# Patient Record
Sex: Male | Born: 1960 | Race: Black or African American | Hispanic: No | Marital: Married | Smoking: Never smoker
Health system: Southern US, Community
[De-identification: ages and names within clinical notes are randomized; demographics above are authoritative.]

## PROBLEM LIST (undated history)

## (undated) DIAGNOSIS — K760 Fatty (change of) liver, not elsewhere classified: Secondary | ICD-10-CM

## (undated) DIAGNOSIS — I10 Essential (primary) hypertension: Secondary | ICD-10-CM

## (undated) DIAGNOSIS — R7303 Prediabetes: Secondary | ICD-10-CM

## (undated) DIAGNOSIS — K649 Unspecified hemorrhoids: Secondary | ICD-10-CM

## (undated) DIAGNOSIS — F1910 Other psychoactive substance abuse, uncomplicated: Secondary | ICD-10-CM

## (undated) DIAGNOSIS — M1990 Unspecified osteoarthritis, unspecified site: Secondary | ICD-10-CM

## (undated) DIAGNOSIS — J45909 Unspecified asthma, uncomplicated: Secondary | ICD-10-CM

## (undated) HISTORY — PX: COLONOSCOPY: SHX174

## (undated) HISTORY — DX: Unspecified hemorrhoids: K64.9

## (undated) HISTORY — PX: NO PAST SURGERIES: SHX2092

---

## 2004-02-21 ENCOUNTER — Emergency Department (HOSPITAL_COMMUNITY): Admission: EM | Admit: 2004-02-21 | Discharge: 2004-02-21 | Payer: Self-pay | Admitting: Emergency Medicine

## 2005-05-04 ENCOUNTER — Emergency Department (HOSPITAL_COMMUNITY): Admission: EM | Admit: 2005-05-04 | Discharge: 2005-05-05 | Payer: Self-pay | Admitting: Emergency Medicine

## 2005-05-12 ENCOUNTER — Emergency Department (HOSPITAL_COMMUNITY): Admission: EM | Admit: 2005-05-12 | Discharge: 2005-05-12 | Payer: Self-pay | Admitting: Family Medicine

## 2005-08-09 ENCOUNTER — Emergency Department (HOSPITAL_COMMUNITY): Admission: EM | Admit: 2005-08-09 | Discharge: 2005-08-09 | Payer: Self-pay | Admitting: Emergency Medicine

## 2005-10-19 ENCOUNTER — Emergency Department (HOSPITAL_COMMUNITY): Admission: EM | Admit: 2005-10-19 | Discharge: 2005-10-19 | Payer: Self-pay | Admitting: Family Medicine

## 2006-03-20 ENCOUNTER — Emergency Department (HOSPITAL_COMMUNITY): Admission: EM | Admit: 2006-03-20 | Discharge: 2006-03-21 | Payer: Self-pay | Admitting: Emergency Medicine

## 2006-04-20 ENCOUNTER — Emergency Department (HOSPITAL_COMMUNITY): Admission: EM | Admit: 2006-04-20 | Discharge: 2006-04-20 | Payer: Self-pay | Admitting: Emergency Medicine

## 2006-04-22 ENCOUNTER — Emergency Department (HOSPITAL_COMMUNITY): Admission: EM | Admit: 2006-04-22 | Discharge: 2006-04-22 | Payer: Self-pay | Admitting: Emergency Medicine

## 2006-07-08 ENCOUNTER — Emergency Department (HOSPITAL_COMMUNITY): Admission: EM | Admit: 2006-07-08 | Discharge: 2006-07-08 | Payer: Self-pay | Admitting: Emergency Medicine

## 2006-10-08 ENCOUNTER — Emergency Department (HOSPITAL_COMMUNITY): Admission: EM | Admit: 2006-10-08 | Discharge: 2006-10-09 | Payer: Self-pay | Admitting: Emergency Medicine

## 2006-10-22 ENCOUNTER — Emergency Department (HOSPITAL_COMMUNITY): Admission: EM | Admit: 2006-10-22 | Discharge: 2006-10-22 | Payer: Self-pay | Admitting: Family Medicine

## 2009-07-15 ENCOUNTER — Emergency Department (HOSPITAL_COMMUNITY): Admission: EM | Admit: 2009-07-15 | Discharge: 2009-07-16 | Payer: Self-pay | Admitting: Emergency Medicine

## 2010-05-24 LAB — URINALYSIS, ROUTINE W REFLEX MICROSCOPIC
Bilirubin Urine: NEGATIVE
Glucose, UA: NEGATIVE mg/dL
Hgb urine dipstick: NEGATIVE
Ketones, ur: NEGATIVE mg/dL
Leukocytes, UA: NEGATIVE
Nitrite: NEGATIVE
Protein, ur: 100 mg/dL — AB
Specific Gravity, Urine: 1.025 (ref 1.005–1.030)
Urobilinogen, UA: 0.2 mg/dL (ref 0.0–1.0)
pH: 8.5 — ABNORMAL HIGH (ref 5.0–8.0)

## 2010-05-24 LAB — CBC
HCT: 42.3 % (ref 39.0–52.0)
Hemoglobin: 14.9 g/dL (ref 13.0–17.0)
MCHC: 35.2 g/dL (ref 30.0–36.0)
MCV: 99.2 fL (ref 78.0–100.0)
Platelets: 242 10*3/uL (ref 150–400)
RBC: 4.27 MIL/uL (ref 4.22–5.81)
RDW: 12.5 % (ref 11.5–15.5)
WBC: 12.1 10*3/uL — ABNORMAL HIGH (ref 4.0–10.5)

## 2010-05-24 LAB — COMPREHENSIVE METABOLIC PANEL
ALT: 13 U/L (ref 0–53)
AST: 27 U/L (ref 0–37)
Albumin: 4.2 g/dL (ref 3.5–5.2)
Alkaline Phosphatase: 48 U/L (ref 39–117)
BUN: 12 mg/dL (ref 6–23)
CO2: 25 mEq/L (ref 19–32)
Calcium: 9.7 mg/dL (ref 8.4–10.5)
Chloride: 105 mEq/L (ref 96–112)
Creatinine, Ser: 1.5 mg/dL (ref 0.4–1.5)
GFR calc Af Amer: >60 mL/min (ref >60)
GFR calc non Af Amer: 50 mL/min — ABNORMAL LOW (ref >60)
Glucose, Bld: 155 mg/dL — ABNORMAL HIGH (ref 70–99)
Potassium: 3.3 mEq/L — ABNORMAL LOW (ref 3.5–5.1)
Sodium: 141 mEq/L (ref 135–145)
Total Bilirubin: 1.9 mg/dL — ABNORMAL HIGH (ref 0.3–1.2)
Total Protein: 7.4 g/dL (ref 6.0–8.3)

## 2010-05-24 LAB — URINE MICROSCOPIC-ADD ON

## 2010-05-24 LAB — DIFFERENTIAL
Basophils Absolute: 0 10*3/uL (ref 0.0–0.1)
Basophils Relative: 0 % (ref 0–1)
Eosinophils Absolute: 0 10*3/uL (ref 0.0–0.7)
Eosinophils Relative: 0 % (ref 0–5)
Lymphocytes Relative: 2 % — ABNORMAL LOW (ref 12–46)
Lymphs Abs: 0.3 10*3/uL — ABNORMAL LOW (ref 0.7–4.0)
Monocytes Absolute: 0.4 10*3/uL (ref 0.1–1.0)
Monocytes Relative: 3 % (ref 3–12)
Neutro Abs: 11.5 10*3/uL — ABNORMAL HIGH (ref 1.7–7.7)
Neutrophils Relative %: 94 % — ABNORMAL HIGH (ref 43–77)

## 2010-05-24 LAB — POCT I-STAT, CHEM 8
BUN: 14 mg/dL (ref 6–23)
Calcium, Ion: 1.06 mmol/L — ABNORMAL LOW (ref 1.12–1.32)
Chloride: 106 mEq/L (ref 96–112)
Creatinine, Ser: 1.3 mg/dL (ref 0.4–1.5)
Glucose, Bld: 157 mg/dL — ABNORMAL HIGH (ref 70–99)
HCT: 44 % (ref 39.0–52.0)
Hemoglobin: 15 g/dL (ref 13.0–17.0)
Potassium: 3.5 mEq/L (ref 3.5–5.1)
Sodium: 142 mEq/L (ref 135–145)
TCO2: 25 mmol/L (ref 0–100)

## 2010-05-24 LAB — LIPASE, BLOOD: Lipase: 17 U/L (ref 11–59)

## 2011-03-15 ENCOUNTER — Emergency Department (INDEPENDENT_AMBULATORY_CARE_PROVIDER_SITE_OTHER)
Admission: EM | Admit: 2011-03-15 | Discharge: 2011-03-15 | Disposition: A | Discharge status: Home / Self Care | Payer: 59 | Source: Home / Self Care | Attending: Emergency Medicine | Admitting: Emergency Medicine

## 2011-03-15 ENCOUNTER — Encounter (HOSPITAL_COMMUNITY): Payer: Self-pay

## 2011-03-15 DIAGNOSIS — J40 Bronchitis, not specified as acute or chronic: Secondary | ICD-10-CM

## 2011-03-15 MED ORDER — AMOXICILLIN 500 MG PO CAPS: 500.0000 mg | ORAL_CAPSULE | Freq: Three times a day (TID) | ORAL | Status: AC

## 2011-03-15 MED ORDER — GUAIFENESIN-CODEINE 100-10 MG/5ML PO SYRP: 5.0000 mL | ORAL_SOLUTION | Freq: Three times a day (TID) | ORAL | Status: AC | PRN

## 2011-03-15 NOTE — ED Notes (Signed)
Hist of asthma as child, feels as if it is coming back, reports yellow secretions; coarse breath sounds noted bilateral ; also concerned about spots on the palms of his hands (undetermined time)

## 2011-03-15 NOTE — ED Provider Notes (Signed)
History     CSN: [id number]  Arrival date & time 03/15/11  1256   First MD Initiated Contact with Patient 03/15/11 1435      Chief Complaint  Patient presents with  . Cough    (Consider location/radiation/quality/duration/timing/severity/associated sxs/prior treatment) Patient is a 51 y.o. male presenting with cough. The history is provided by the patient.  Cough This is a recurrent problem. The current episode started more than 1 week ago. The problem occurs constantly. The cough is productive of brown sputum. There has been no fever. Associated symptoms include ear congestion. Pertinent negatives include no chills, no sweats, no shortness of breath and no wheezing. He has tried decongestants for the symptoms. The treatment provided no relief. He is not a smoker. His past medical history is significant for asthma.    History reviewed. No pertinent past medical history.  History reviewed. No pertinent past surgical history.  History reviewed. No pertinent family history.  History  Substance Use Topics  . Smoking status: Never Smoker   . Smokeless tobacco: Not on file  . Alcohol Use: Yes      Review of Systems  Constitutional: Negative for chills.  Eyes: Negative for pain.  Respiratory: Positive for cough. Negative for shortness of breath and wheezing.   Skin: Positive for rash.    Allergies  Review of patient's allergies indicates no known allergies.  Home Medications   Current Outpatient Rx  Name Route Sig Dispense Refill  . AMOXICILLIN 500 MG PO CAPS Oral Take 1 capsule (500 mg total) by mouth 3 (three) times daily. 30 capsule 0  . GUAIFENESIN-CODEINE 100-10 MG/5ML PO SYRP Oral Take 5 mLs by mouth 3 (three) times daily as needed for cough. 120 mL 0    BP 126/85  Pulse 76  Temp(Src) 98.3 F (36.8 C) (Oral)  Resp 16  SpO2 100%  Physical Exam  Nursing note and vitals reviewed. Constitutional: He appears well-developed and well-nourished.  HENT:    Mouth/Throat: No oropharyngeal exudate.  Eyes: Conjunctivae are normal.  Neck: No JVD present.  Cardiovascular: Normal rate and regular rhythm.   Pulmonary/Chest: Not tachypneic. No respiratory distress. He has no decreased breath sounds. He has no wheezes. He has rhonchi. He has no rales.  Lymphadenopathy:    He has no cervical adenopathy.  Skin: Rash noted.       ED Course  Procedures (including critical care time)   Labs Reviewed  RPR  HIV ANTIBODY (ROUTINE TESTING)   No results found.   1. Bronchitis       MDM  Recurrent cough- now productive also with a papular erythematous eruption on palmar side both hands, fading- mildly pruritic he described- part of a viral respiratory process? (RPR ordered)        Jimmie Molly, MD 03/15/11 [facility] [place]

## 2011-08-26 ENCOUNTER — Emergency Department (HOSPITAL_COMMUNITY)
Admission: EM | Admit: 2011-08-26 | Discharge: 2011-08-26 | Disposition: A | Discharge status: Home / Self Care | Payer: 59 | Source: Home / Self Care | Attending: Emergency Medicine | Admitting: Emergency Medicine

## 2011-08-26 ENCOUNTER — Encounter (HOSPITAL_COMMUNITY): Payer: Self-pay

## 2011-08-26 DIAGNOSIS — L0291 Cutaneous abscess, unspecified: Secondary | ICD-10-CM

## 2011-08-26 DIAGNOSIS — R591 Generalized enlarged lymph nodes: Secondary | ICD-10-CM

## 2011-08-26 DIAGNOSIS — L0390 Cellulitis, unspecified: Secondary | ICD-10-CM

## 2011-08-26 MED ORDER — SULFAMETHOXAZOLE-TMP DS 800-160 MG PO TABS: 2.0000 | ORAL_TABLET | Freq: Two times a day (BID) | ORAL | Status: AC

## 2011-08-26 MED ORDER — CEPHALEXIN 500 MG PO CAPS: 500.0000 mg | ORAL_CAPSULE | Freq: Three times a day (TID) | ORAL | Status: AC

## 2011-08-26 NOTE — ED Provider Notes (Signed)
Chief Complaint  Patient presents with  . Groin Pain  . Rash    History of Present Illness:   The patient has had a three-day history of a boil in his right groin which is draining some pus. He also has an enlarged lymph node in his right groin. This is worse if he walks. He denies any fever or chills. No prior history of boils. He also has a mildly pruritic rash on his buttocks which has been present for about a week.  Review of Systems:  Other than noted above, the patient denies any of the following symptoms: Systemic:  No fever, chills, sweats, weight loss, or fatigue. ENT:  No nasal congestion, rhinorrhea, sore throat, swelling of lips, tongue or throat. Resp:  No cough, wheezing, or shortness of breath. Skin:  No rash, itching, nodules, or suspicious lesions.  PMFSH:  Past medical history, family history, social history, meds, and allergies were reviewed.  Physical Exam:   Vital signs:  BP 141/91  Pulse 85  Temp 98.9 F (37.2 C) (Oral)  Resp 18  SpO2 100% Gen:  Alert, oriented, in no distress. ENT:  Pharynx clear, no intraoral lesions, moist mucous membranes. Lungs:  Clear to auscultation. Skin:  He has a 1 cm indurated papule on the right medial thigh, just adjacent to the scrotum. This was ulcerated and draining some pus. There is no fluctuance. It was mildly tender to palpation. He has several enlarged lymph nodes in his right groin area. No hernia. Testes are normal. Genitalia are normal. Exam of the buttock area reveals no rash. He does have a rosette of external hemorrhoids which he has been using some suppositories for.  Assessment:  The primary encounter diagnosis was Abscess. A diagnosis of Lymphadenopathy was also pertinent to this visit.  Plan:   1.  The following meds were prescribed:   New Prescriptions   CEPHALEXIN (KEFLEX) 500 MG CAPSULE    Take 1 capsule (500 mg total) by mouth 3 (three) times daily.   SULFAMETHOXAZOLE-TRIMETHOPRIM (BACTRIM DS) 800-160 MG PER  TABLET    Take 2 tablets by mouth 2 (two) times daily.   2.  The patient was instructed in symptomatic care and handouts were given. 3.  The patient was told to return if becoming worse in any way, if no better in 3 or 4 days, and given some red flag symptoms that would indicate earlier return.     Reuben Likes, MD 08/26/11 [phone]

## 2011-08-26 NOTE — Discharge Instructions (Signed)

## 2011-08-26 NOTE — ED Notes (Signed)
Pt has rt groin bulging and pain that started yesterday and is worse today.  He also has rash on upper left leg and buttocks.

## 2012-06-13 ENCOUNTER — Encounter (HOSPITAL_COMMUNITY): Payer: Self-pay

## 2012-06-13 ENCOUNTER — Emergency Department (HOSPITAL_COMMUNITY)
Admission: EM | Admit: 2012-06-13 | Discharge: 2012-06-13 | Disposition: A | Discharge status: Home / Self Care | Payer: Self-pay | Attending: Emergency Medicine | Admitting: Emergency Medicine

## 2012-06-13 DIAGNOSIS — R197 Diarrhea, unspecified: Secondary | ICD-10-CM | POA: Insufficient documentation

## 2012-06-13 DIAGNOSIS — M549 Dorsalgia, unspecified: Secondary | ICD-10-CM | POA: Insufficient documentation

## 2012-06-13 DIAGNOSIS — R61 Generalized hyperhidrosis: Secondary | ICD-10-CM | POA: Insufficient documentation

## 2012-06-13 DIAGNOSIS — R059 Cough, unspecified: Secondary | ICD-10-CM | POA: Insufficient documentation

## 2012-06-13 DIAGNOSIS — R112 Nausea with vomiting, unspecified: Secondary | ICD-10-CM | POA: Insufficient documentation

## 2012-06-13 DIAGNOSIS — R6883 Chills (without fever): Secondary | ICD-10-CM | POA: Insufficient documentation

## 2012-06-13 DIAGNOSIS — R05 Cough: Secondary | ICD-10-CM | POA: Insufficient documentation

## 2012-06-13 LAB — POCT I-STAT, CHEM 8
BUN: 12 mg/dL (ref 6–23)
Calcium, Ion: 1.15 mmol/L (ref 1.12–1.23)
Chloride: 105 meq/L (ref 96–112)
Creatinine, Ser: 1.2 mg/dL (ref 0.50–1.35)
Glucose, Bld: 156 mg/dL — ABNORMAL HIGH (ref 70–99)
HCT: 47 % (ref 39.0–52.0)
Hemoglobin: 16 g/dL (ref 13.0–17.0)
Potassium: 3.9 meq/L (ref 3.5–5.1)
Sodium: 140 meq/L (ref 135–145)
TCO2: 22 mmol/L (ref 0–100)

## 2012-06-13 MED ORDER — ONDANSETRON HCL 4 MG/2ML IJ SOLN
4.0000 mg | Freq: Once | INTRAMUSCULAR | Status: AC
  Administered 2012-06-13: 4 mg via INTRAVENOUS
  Filled 2012-06-13: qty 2

## 2012-06-13 MED ORDER — SODIUM CHLORIDE 0.9 % IV BOLUS (SEPSIS)
1000.0000 mL | Freq: Once | INTRAVENOUS | Status: AC
  Administered 2012-06-13: 1000 mL via INTRAVENOUS

## 2012-06-13 MED ORDER — ONDANSETRON 4 MG PO TBDP
8.0000 mg | ORAL_TABLET | Freq: Once | ORAL | Status: AC
  Administered 2012-06-13: 8 mg via ORAL
  Filled 2012-06-13: qty 2

## 2012-06-13 MED ORDER — KETOROLAC TROMETHAMINE 60 MG/2ML IM SOLN
30.0000 mg | Freq: Once | INTRAMUSCULAR | Status: DC
  Filled 2012-06-13: qty 2

## 2012-06-13 MED ORDER — KETOROLAC TROMETHAMINE 30 MG/ML IJ SOLN
30.0000 mg | Freq: Once | INTRAMUSCULAR | Status: AC
  Administered 2012-06-13: 30 mg via INTRAVENOUS

## 2012-06-13 MED ORDER — LORAZEPAM 2 MG/ML IJ SOLN
1.0000 mg | Freq: Once | INTRAMUSCULAR | Status: AC
  Administered 2012-06-13: 1 mg via INTRAVENOUS
  Filled 2012-06-13: qty 1

## 2012-06-13 MED ORDER — ACETAMINOPHEN 500 MG PO TABS
1000.0000 mg | ORAL_TABLET | Freq: Once | ORAL | Status: AC
  Administered 2012-06-13: 1000 mg via ORAL
  Filled 2012-06-13: qty 2

## 2012-06-13 MED ORDER — ONDANSETRON HCL 4 MG PO TABS: 4.0000 mg | ORAL_TABLET | Freq: Three times a day (TID) | ORAL | Status: DC | PRN

## 2012-06-13 NOTE — ED Provider Notes (Signed)
History     CSN: [id number]  Arrival date & time 06/13/12  1431   First MD Initiated Contact with Patient 06/13/12 1759      Chief Complaint  Patient presents with  . Influenza    (Consider location/radiation/quality/duration/timing/severity/associated sxs/prior treatment) HPI Comments: Patient reports he has had nausea and diarrhea x 2 days and vomiting x 1 day.  Associated myalgias and chills.  Unsure of fevers.  No known sick contacts, no strange foods.  Denies abdominal pain.  Pt did have slight cough two days ago that has resolved.  No upper respiratory symptoms.    Patient is a 52 y.o. male presenting with flu symptoms. The history is provided by the patient.  Influenza Presenting symptoms: cough, diarrhea, nausea and vomiting   Presenting symptoms: no headaches, no shortness of breath and no sore throat   Associated symptoms: chills   Associated symptoms: no congestion     History reviewed. No pertinent past medical history.  History reviewed. No pertinent past surgical history.  No family history on file.  History  Substance Use Topics  . Smoking status: Never Smoker   . Smokeless tobacco: Not on file  . Alcohol Use: Yes      Review of Systems  Constitutional: Positive for chills and diaphoresis.  HENT: Negative for congestion and sore throat.   Respiratory: Positive for cough. Negative for shortness of breath.   Cardiovascular: Negative for chest pain.  Gastrointestinal: Positive for nausea, vomiting and diarrhea. Negative for abdominal pain, constipation and blood in stool.  Genitourinary: Negative for dysuria, urgency and frequency.  Musculoskeletal: Positive for back pain.  Neurological: Negative for headaches.    Allergies  Review of patient's allergies indicates no known allergies.  Home Medications  No current outpatient prescriptions on file.  BP 193/117  Pulse 81  Temp(Src) 99 F (37.2 C) (Oral)  SpO2 99%  Physical Exam  Nursing note  and vitals reviewed. Constitutional: He appears well-developed and well-nourished. No distress.  HENT:  Head: Normocephalic and atraumatic.  Neck: Neck supple.  Cardiovascular: Normal rate and regular rhythm.   Pulmonary/Chest: Effort normal and breath sounds normal. No respiratory distress. He has no wheezes. He has no rales.  Abdominal: Soft. Bowel sounds are normal. He exhibits no distension and no mass. There is no tenderness. There is no rebound and no guarding.  Neurological: He is alert. He exhibits normal muscle tone.  Skin: He is not diaphoretic.    ED Course  Procedures (including critical care time)  Labs Reviewed  POCT I-STAT, CHEM 8 - Abnormal; Notable for the following:    Glucose, Bld 156 (*)    All other components within normal limits   No results found.  Filed Vitals:   06/13/12 1457  BP: 193/117  Pulse: 81  Temp: 99 F (37.2 C)     1. Nausea vomiting and diarrhea      MDM  Pt with N/V/D, myalgias. Likely viral etiology.  Pt to get IVF, IV zofran and toradol, PO challenge.  Pt just got IV at shift change.  BP to be rechecked.  I have signed pt out to Arthor Captain, PA-C, who assumes care of patient at change of shift.  Anticipate D/C home when symptoms have improved.          Trixie Dredge, PA-C 06/13/12 2021

## 2012-06-13 NOTE — ED Notes (Addendum)
Pt. Having flu-like symptoms with chills, also nauseated and diarrhea.  Pt. Is spitting in a bag.   Symptoms began yesterday. Placed pt. In a mask

## 2012-06-13 NOTE — ED Notes (Signed)
Two unsuccessful iv attempts made.  One in left hand, one in left AC.  Blood drawn from left IV attempt site.

## 2012-06-13 NOTE — ED Notes (Signed)
Pt reports s/s started Tuesday with a cough. Pt poor historian. Pt constantly yawning while speaking. PA-C in room at this time.

## 2012-06-13 NOTE — ED Provider Notes (Signed)
10:49 PM Assumed care of the patient from [place].  The patient presented with symptoms of nausea, vomiting, diarrhea for 2 days.  Was hypertensive. Blood pressure is now 142/55.  Discharged home with Zofran.Patient with symptoms consistent with viral gastroenteritis.  Vitals are stable, no fever.  No signs of dehydration, tolerating PO fluids > 6 oz.  Lungs are clear.  No focal abdominal pain, no concern for appendicitis, cholecystitis, pancreatitis, ruptured viscus, UTI, kidney stone, or any other abdominal etiology.  Supportive therapy indicated with return if symptoms worsen.  Patient counseled.   Arthor Captain, PA-C 06/13/12 2252

## 2012-06-14 NOTE — ED Provider Notes (Signed)
Medical screening examination/treatment/procedure(s) were performed by non-physician practitioner and as supervising physician I was immediately available for consultation/collaboration.  Juliet Rude. Rubin Payor, MD 06/14/12 0020

## 2012-06-17 NOTE — ED Provider Notes (Signed)
History/physical exam/procedure(s) were performed by non-physician practitioner and as supervising physician I was immediately available for consultation/collaboration. I have reviewed all notes and am in agreement with care and plan.   Hilario Quarry, MD 06/17/12 [phone]

## 2012-07-03 ENCOUNTER — Emergency Department (INDEPENDENT_AMBULATORY_CARE_PROVIDER_SITE_OTHER)
Admission: EM | Admit: 2012-07-03 | Discharge: 2012-07-03 | Disposition: A | Discharge status: Home / Self Care | Payer: Self-pay | Source: Home / Self Care | Attending: Emergency Medicine | Admitting: Emergency Medicine

## 2012-07-03 ENCOUNTER — Encounter (HOSPITAL_COMMUNITY): Payer: Self-pay | Admitting: *Deleted

## 2012-07-03 DIAGNOSIS — N489 Disorder of penis, unspecified: Secondary | ICD-10-CM

## 2012-07-03 DIAGNOSIS — R21 Rash and other nonspecific skin eruption: Secondary | ICD-10-CM

## 2012-07-03 MED ORDER — CLOTRIMAZOLE-BETAMETHASONE 1-0.05 % EX CREA: TOPICAL_CREAM | CUTANEOUS | Status: DC

## 2012-07-03 NOTE — ED Provider Notes (Signed)
History     CSN: [id number]  Arrival date & time 07/03/12  1020   First MD Initiated Contact with Patient 07/03/12 1041      Chief Complaint  Patient presents with  . Rash    (Consider location/radiation/quality/duration/timing/severity/associated sxs/prior treatment) Patient is a 52 y.o. male presenting with rash. The history is provided by the patient. No language interpreter was used.  Rash Location: penile skin. Quality: itchiness and redness   Severity:  Mild Onset quality:  Gradual Duration:  1 week Timing:  Constant Context: not animal contact, not chemical exposure, not exposure to similar rash and not sick contacts   Relieved by:  Nothing Worsened by:  Nothing tried Ineffective treatments:  None tried Associated symptoms: no fever and no joint pain     History reviewed. No pertinent past medical history.  History reviewed. No pertinent past surgical history.  No family history on file.  History  Substance Use Topics  . Smoking status: Never Smoker   . Smokeless tobacco: Not on file  . Alcohol Use: Yes      Review of Systems  Constitutional: Negative for fever.  Musculoskeletal: Negative for arthralgias.  Skin: Positive for rash.  All other systems reviewed and are negative.    Allergies  Review of patient's allergies indicates no known allergies.  Home Medications   Current Outpatient Rx  Name  Route  Sig  Dispense  Refill  . ondansetron (ZOFRAN) 4 MG tablet   Oral   Take 1 tablet (4 mg total) by mouth every 8 (eight) hours as needed for nausea.   15 tablet   0     There were no vitals taken for this visit.  Physical Exam  Constitutional: He appears well-developed and well-nourished.  HENT:  Head: Normocephalic and atraumatic.  Eyes: Pupils are equal, round, and reactive to light.  Neck: Normal range of motion.  Cardiovascular: Normal rate.   Pulmonary/Chest: Effort normal.  Abdominal: Soft.  Genitourinary:  Penile exam showed 2  red,dry sores penile skin NO DRAINAGE, NO ASSOCIATED  CELLULITIS NO INGUINAL ADENOPATHY   Musculoskeletal: Normal range of motion.  Neurological: He is alert.  Skin: Skin is warm and dry.    ED Course  Procedures (including critical care time)  Labs Reviewed  HERPES SIMPLEX VIRUS CULTURE   No results found.   No diagnosis found. PENILE SKIN RASH   MDM          Duwayne Heck de Marcello Moores, MD 07/04/12 2036

## 2012-07-03 NOTE — ED Notes (Signed)
Pt  Reports  A  Rash   On  Bottom  Of  Penis        X  1  Week  Itches   When  He  Takes  A  Shower    Denies  Any   Penile  discharge  Or  Any  Burning on  Urination

## 2012-07-05 LAB — HERPES SIMPLEX VIRUS CULTURE: Culture: NOT DETECTED

## 2013-01-26 ENCOUNTER — Encounter (HOSPITAL_COMMUNITY): Payer: Self-pay | Admitting: Emergency Medicine

## 2013-01-26 ENCOUNTER — Emergency Department (INDEPENDENT_AMBULATORY_CARE_PROVIDER_SITE_OTHER)
Admission: EM | Admit: 2013-01-26 | Discharge: 2013-01-26 | Disposition: A | Discharge status: Home / Self Care | Payer: Self-pay | Source: Home / Self Care | Attending: Emergency Medicine | Admitting: Emergency Medicine

## 2013-01-26 DIAGNOSIS — B86 Scabies: Secondary | ICD-10-CM

## 2013-01-26 MED ORDER — HYDROXYZINE HCL 25 MG PO TABS
25.0000 mg | ORAL_TABLET | Freq: Four times a day (QID) | ORAL | Status: DC
Start: 2013-01-26 — End: 2013-10-21

## 2013-01-26 MED ORDER — PREDNISONE 20 MG PO TABS: 20.0000 mg | ORAL_TABLET | Freq: Two times a day (BID) | ORAL | Status: DC

## 2013-01-26 MED ORDER — PERMETHRIN 5 % EX CREA: TOPICAL_CREAM | CUTANEOUS | Status: DC

## 2013-01-26 NOTE — ED Notes (Signed)
C/O faint sporadic red pruritic bumps to entire body, especially between fingers, x 1-1.5 wks.  Denies any other members of household having same rash.  Has taken Benadryl and applied hydrocortisone cream without any relief.

## 2013-01-26 NOTE — ED Provider Notes (Signed)
Chief Complaint:   Chief Complaint  Patient presents with  . Rash    History of Present Illness:   Nicholas Richmond is a 52 year old male who's had a two-week history of a pruritic rash that began on his hands and spread to his entire body. This itches particularly at nighttime. He's had no exposures to anyone with a similar rash and denies any known exposure to scabies. He's never had anything like this before. There's been no exposure to any known allergens or antigens. He denies any difficulty breathing or swelling of his lips, tongue, or throat.  Review of Systems:  Other than noted above, the patient denies any of the following symptoms: Systemic:  No fever, chills, sweats, weight loss, or fatigue. ENT:  No nasal congestion, rhinorrhea, sore throat, swelling of lips, tongue or throat. Resp:  No cough, wheezing, or shortness of breath. Skin:  No rash, itching, nodules, or suspicious lesions.  PMFSH:  Past medical history, family history, social history, meds, and allergies were reviewed.   Physical Exam:   Vital signs:  BP 113/75  Pulse 81  Temp(Src) 98.3 F (36.8 C) (Oral)  Resp 20  SpO2 98% Gen:  Alert, oriented, in no distress. ENT:  Pharynx clear, no intraoral lesions, moist mucous membranes. Lungs:  Clear to auscultation. Skin:  He has scattered small papules on his hands including in the webspace between his fingers, the size of the fingers, wrists, forearms, trunk, abdomen, and lower extremities.  Assessment:  The encounter diagnosis was Scabies.  Plan:   1.  Meds:  The following meds were prescribed:   Discharge Medication List as of 01/26/2013 12:43 PM    START taking these medications   Details  hydrOXYzine (ATARAX/VISTARIL) 25 MG tablet Take 1 tablet (25 mg total) by mouth every 6 (six) hours., Starting 01/26/2013, Until Discontinued, Normal    permethrin (ELIMITE) 5 % cream Apply head to toe at bedtime.  Leave on for 8 hours.  Scrub off next morning.  Repeat same  procedure in 1 week., Normal    predniSONE (DELTASONE) 20 MG tablet Take 1 tablet (20 mg total) by mouth 2 (two) times daily., Starting 01/26/2013, Until Discontinued, Normal        2.  Patient Education/Counseling:  The patient was given appropriate handouts, self care instructions, and instructed in symptomatic relief.  Emphasized the importance of 2 treatments, one week apart, laundering bedclothes, pajamas, and underwear, and spraying with Rid.  3.  Follow up:  The patient was told to follow up if no better in 3 to 4 days, if becoming worse in any way, and given some red flag symptoms such as persistent rash which would prompt immediate return.  Follow up here as necessary.      Reuben Likes, MD 01/26/13 2214

## 2013-10-21 ENCOUNTER — Emergency Department (HOSPITAL_COMMUNITY)
Admission: EM | Admit: 2013-10-21 | Discharge: 2013-10-21 | Disposition: A | Discharge status: Home / Self Care | Payer: 59 | Attending: Emergency Medicine | Admitting: Emergency Medicine

## 2013-10-21 ENCOUNTER — Encounter (HOSPITAL_COMMUNITY): Payer: Self-pay | Admitting: Emergency Medicine

## 2013-10-21 DIAGNOSIS — IMO0001 Reserved for inherently not codable concepts without codable children: Secondary | ICD-10-CM | POA: Insufficient documentation

## 2013-10-21 DIAGNOSIS — Z79899 Other long term (current) drug therapy: Secondary | ICD-10-CM | POA: Insufficient documentation

## 2013-10-21 DIAGNOSIS — IMO0002 Reserved for concepts with insufficient information to code with codable children: Secondary | ICD-10-CM | POA: Insufficient documentation

## 2013-10-21 DIAGNOSIS — J029 Acute pharyngitis, unspecified: Secondary | ICD-10-CM | POA: Insufficient documentation

## 2013-10-21 DIAGNOSIS — J069 Acute upper respiratory infection, unspecified: Secondary | ICD-10-CM

## 2013-10-21 DIAGNOSIS — R030 Elevated blood-pressure reading, without diagnosis of hypertension: Secondary | ICD-10-CM | POA: Insufficient documentation

## 2013-10-21 LAB — RAPID STREP SCREEN (MED CTR MEBANE ONLY): Streptococcus, Group A Screen (Direct): NEGATIVE

## 2013-10-21 MED ORDER — DM-GUAIFENESIN ER 30-600 MG PO TB12: 1.0000 | ORAL_TABLET | Freq: Two times a day (BID) | ORAL | Status: DC

## 2013-10-21 MED ORDER — HYDROCODONE-ACETAMINOPHEN 7.5-325 MG/15ML PO SOLN: 15.0000 mL | ORAL | Status: DC | PRN

## 2013-10-21 MED ORDER — BENZONATATE 100 MG PO CAPS: 100.0000 mg | ORAL_CAPSULE | Freq: Three times a day (TID) | ORAL | Status: DC

## 2013-10-21 NOTE — Discharge Instructions (Signed)
Call for a follow up appointment with a Family or Primary Care Provider.  Return if Symptoms worsen.   Take medication as prescribed.  Salt water gargles 3-4 times a day.   Emergency Department Resource Guide 1) Find a Doctor and Pay Out of Pocket Although you won't have to find out who is covered by your insurance plan, it is a good idea to ask around and get recommendations. You will then need to call the office and see if the doctor you have chosen will accept you as a new patient and what types of options they offer for patients who are self-pay. Some doctors offer discounts or will set up payment plans for their patients who do not have insurance, but you will need to ask so you aren't surprised when you get to your appointment.  2) Contact Your Local Health Department Not all health departments have doctors that can see patients for sick visits, but many do, so it is worth a call to see if yours does. If you don't know where your local health department is, you can check in your phone book. The CDC also has a tool to help you locate your state's health department, and many state websites also have listings of all of their local health departments.  3) Find a [facility] If your illness is not likely to be very severe or complicated, you may want to try a walk in clinic. These are popping up all over the country in pharmacies, drugstores, and shopping centers. They're usually staffed by nurse practitioners or physician assistants that have been trained to treat common illnesses and complaints. They're usually fairly quick and inexpensive. However, if you have serious medical issues or chronic medical problems, these are probably not your best option.  No Primary Care Doctor: - Call Health Connect at  [phone] - they can help you locate a primary care doctor that  accepts your insurance, provides certain services, etc. - Physician Referral Service- [phone]  Chronic Pain  Problems: Organization         Address  Phone   Notes  [facility]  [phone] Patients need to be referred by their primary care doctor.   Medication Assistance: Organization         Address  Phone   Notes  [facility] Medication [facility] [address] [place], [place] [address] [phone] --Must be a resident of [facility] -- Must have NO insurance coverage whatsoever (no Medicaid/ Medicare, etc.) -- The pt. MUST have a primary care doctor that directs their care regularly and follows them in the community   MedAssist  [phone]   [employer]  [phone]    Agencies that provide inexpensive medical care: Organization         Address  Phone   Notes  [place]  [phone]   [facility]    [phone]   [facility] [place], [place] [address] [phone]   [place] [address], [place] [phone]   Planned Parenthood    [phone]   [facility]    [phone]   [place] and [place], [place] Phone:  [phone], Fax:  [phone] Hours of Operation:  9 am - 6 pm, M-F.  Also accepts Medicaid/Medicare and self-pay.  [facility] for Children  [place] [employer], [address], [place] Phone: [phone], Fax: [phone]. Hours of Operation:  8:30 am - 5:30 pm, M-F.  Also accepts Medicaid and self-pay.  [facility] [place] [address], [place] Phone: [phone]   [place], [place], [place] [phone], Ext. [phone] Mondays & Thursdays: 7-9 AM.  First 15 patients are seen on a first come, first serve basis.    [place] Providers:  Organization         Address  Phone   Notes  [facility] [address], [place] [phone] Also  accepts self-pay patients.  [facility] [address] [place], [place]  [phone]   [place], [address], [place] [phone]   [facility] [address], [place] [phone]   Lucianne Lei [address], [place]   [phone] Only accepts [place] Access [place] patients after they have their name applied to their card.   Self-Pay (no insurance) in [facility]:  Organization         Address  Phone   Notes  Sickle Cell Patients, [facility] Internal Medicine [place] [phone]   [facility] Urgent Care [place] [phone]   [facility] [place]  [place], [place], [place] [phone]   [facility]/Dr. Osei-Bonsu  [address] [place] or [address], [place] [phone] Phone number for both [place] and [place] locations is the same.  Urgent Medical and [facility] [address], [place] [phone]   [facility] [address] [place] or [address] [phone]   [facility] [address] [place] [phone], phone; [phone], fax Sees patients 1st and 3rd Saturday of every month.  Must not qualify for public or private insurance (i.e. Medicaid, Medicare, [place] Health Choice, Veterans' Benefits)  Household income should be no more than 200% of the poverty level The clinic cannot treat you if you are pregnant or think you are pregnant  Sexually transmitted diseases are not treated at the clinic.    Dental Care: Organization         Address  Phone  Notes  [facility] Department of [facility] [place] [phone] Accepts children up to age 37 who are enrolled in [place] or [place]; pregnant  women with a Medicaid card; and children who have applied for Medicaid or [place] Health Choice, but were declined, whose parents can pay a reduced fee at time of service.  [facility] Department of [facility]  [address], [place] [phone] Accepts children up to age 58 who are enrolled in [place] or [place]; pregnant women with a Medicaid card; and children who have applied for Medicaid or [place] Health Choice, but were declined, whose parents can pay a reduced fee at time of service.  [facility] Adult [facility] Access PROGRAM  [place] [phone] Patients are seen by appointment only. Walk-ins are not accepted. [place] will see patients 22 years of age and older. Monday - Tuesday (8am-5pm) Most Wednesdays (8:30-5pm) $30 per visit, cash only  [place] Adult  Dental Access PROGRAM  [address], [facility] [phone] Patients are seen by appointment only. Walk-ins are not accepted. [place] will see patients 60 years of age and older. One Wednesday Evening (Monthly: Volunteer Based).  $30 per visit, cash only  [place]  [phone] for adults; Children under age 4, call Graduate Pediatric Dentistry at [phone]. Children aged 49-14, please call [phone] to request a pediatric application.  Dental services are provided in all areas of dental care including fillings, crowns and bridges, complete and partial dentures, implants, gum treatment, root canals, and extractions. Preventive care is also provided. Treatment is provided to both adults and children. Patients are selected via a lottery and there is often a waiting list.   [facility] [address] [place]  [phone] [email or web address]   [facility] [address] [place], [place] [phone], Ext. [phone] Second and Fourth Thursday of each month, opens at 6:30 AM; Clinic ends at 9 AM.  Patients are  seen on a first-come first-served basis, and a limited number are seen during each clinic.   [facility]  [address] Hillard Danker [place], [place] [phone]   Eligibility Requirements You must have lived in [place], [place], or [place] counties for at least the last three months.   You cannot be eligible for state or federal sponsored [employer], including [employer], [place], or [employer].   You generally cannot be eligible for healthcare insurance through your employer.    How to apply: Eligibility screenings are held every Tuesday and Wednesday afternoon from 1:00 pm until 4:00 pm. You do not need an appointment for the interview!  [facility] [address] [place], [place]   [place]  [place] Department  [place]  [phone]    Behavioral Health Resources in the Community: Intensive Outpatient Programs Organization         Address  Phone  Notes  [place]. [address], [place], [place] [phone]   [facility] Outpatient [address], [place], [place]   ADS: Alcohol & Drug Svcs [address], [place], [place]   [place] [address],  [place], [place] or [phone]   Substance Abuse Resources Organization         Address  Phone  Notes  Alcohol and Drug Services  [phone]   [place]  [phone]   The [place]   Chinita Pester  [phone]   Residential & Outpatient Substance Abuse Program  [phone]   Psychological Services Organization         Address  Phone  Notes  [facility]  [place]  [phone]   [place] [address] [place] [phone] or [phone]    Mobile Crisis  Teams Organization         Address  Phone  Notes  [facility], Mobile Crisis Care Unit  [phone]   [facility] Psychotherapeutic Services  [address] [place], [place]   [place] Levels [address] [place] [phone]    Self-Help/Support Groups Organization         Address  Phone             Notes  Mental  Health Assoc[facility] of [place] - variety of support groups  [place] Call for more information  Narcotics Anonymous (NA), [facility] [address], [employer] [place]  2 meetings at this location   Special educational needs teacher         Address  Phone  Notes  ASAP Residential Treatment [place],    [place]  [phone]   [facility]  [address], [place] [phone], [place], [place]   [place], [place] [phone] Admissions: 8am-3pm M-F  Incentives Substance [place] [address]    [place], [place] [phone]   [facility] [address] [place], [place], [place]   [facility] [address],  [place], [place]   [facility]ight Programs - Intensive Outpatient [address], [place], [place]   [facility] [place].,  [place], [place] [phone] or [phone]   [facility] Services (RTS) [address] [place], [place] Accepts Medicaid  [facility] [address]  [place] [phone] Substance Abuse/Addiction Treatment   [facility] Organization         Address  Phone  Notes  [facility]  [phone]   Domenic Schwab, PhD [address] Arlis Porta [place], [place]   [phone] or [phone]   [place], [place] [phone]   [facility] [address] [place], [place] [phone]  Insurance/Medicaid/sponsorship through [facility] Families [address] [place]                                    [place], [place] [phone] [place] [address], [place] [phone]    Dr. Adele Schilder  [phone]   [facility] Dept. 1) [address], [place] 2) [place] 3)  [place] [address], [place] [phone]  [phone]   [place] [phone] or [phone] (After Hours)

## 2013-10-21 NOTE — ED Provider Notes (Signed)
CSN: [id number]     Arrival date & time 10/21/13  1323 History  This chart was scribed for non-physician practitioner, [name], PA-C working with Wandra Arthurs, MD by Frederich Balding, ED scribe. This patient was seen in room TR08C/TR08C and the patient's care was started at [facility] PM.   Chief Complaint  Patient presents with  . Sore Throat  . Generalized Body Aches   HPI Comments: Nicholas Richmond is a 53 y.o. male who presents to the Emergency Department complaining of gradual onset sore throat, rhinorrhea, cough and generalized body aches that started 2 days ago. States symptoms worsened earlier today. Pt has taken sudafed with no relief. Reports working in a cold environment. Denies fever. Pt has been around his children but states they are not sick. Denies other sick contacts.   The history is provided by the patient. No language interpreter was used.    History reviewed. No pertinent past medical history. History reviewed. No pertinent past surgical history. History reviewed. No pertinent family history. History  Substance Use Topics  . Smoking status: Never Smoker   . Smokeless tobacco: Not on file  . Alcohol Use: Yes     Comment: occasional    Review of Systems  Constitutional: Negative for fever.  HENT: Positive for rhinorrhea and sore throat.   Respiratory: Positive for cough.   Musculoskeletal: Positive for myalgias.  All other systems reviewed and are negative.  Allergies  Review of patient's allergies indicates no known allergies.  Home Medications   Prior to Admission medications   Medication Sig Start Date End Date Taking? Authorizing Provider  clotrimazole-betamethasone (LOTRISONE) cream Apply to affected area 2 times daily prn 07/03/12   Quentin Angst de Arsenio Katz, MD  hydrOXYzine (ATARAX/VISTARIL) 25 MG tablet Take 1 tablet (25 mg total) by mouth every 6 (six) hours. 01/26/13   Harden Mo, MD  ondansetron (ZOFRAN) 4 MG tablet Take 1 tablet (4 mg total) by mouth  every 8 (eight) hours as needed for nausea. 06/13/12   Clayton Bibles, PA-C  permethrin (ELIMITE) 5 % cream Apply head to toe at bedtime.  Leave on for 8 hours.  Scrub off next morning.  Repeat same procedure in 1 week. 01/26/13   Harden Mo, MD  predniSONE (DELTASONE) 20 MG tablet Take 1 tablet (20 mg total) by mouth 2 (two) times daily. 01/26/13   Harden Mo, MD   BP 144/107  Pulse 83  Temp(Src) 98.2 F (36.8 C) (Oral)  Resp 18  SpO2 98%  Physical Exam  Nursing note and vitals reviewed. Constitutional: He is oriented to person, place, and time. He appears well-developed and well-nourished.  Non-toxic appearance. He does not have a sickly appearance. He does not appear ill. No distress.  HENT:  Head: Normocephalic and atraumatic.  Right Ear: Tympanic membrane and ear canal normal. Tympanic membrane is not retracted and not bulging.  Left Ear: Tympanic membrane and ear canal normal. No drainage. Tympanic membrane is not retracted and not bulging.  Nose: Rhinorrhea present.  Mouth/Throat: Uvula is midline and mucous membranes are normal. No oral lesions. No trismus in the jaw. Posterior oropharyngeal erythema present. No oropharyngeal exudate, posterior oropharyngeal edema or tonsillar abscesses.  Eyes: Conjunctivae and EOM are normal.  Neck: Neck supple.  Pulmonary/Chest: Effort normal and breath sounds normal. No respiratory distress. He has no wheezes. He has no rhonchi. He has no rales.  Musculoskeletal: Normal range of motion.  Lymphadenopathy:       Head (  right side): No submental, no submandibular and no tonsillar adenopathy present.       Head (left side): No submental, no submandibular and no tonsillar adenopathy present.    He has no cervical adenopathy.       Right cervical: No superficial cervical adenopathy present.      Left cervical: No superficial cervical adenopathy present.  Neurological: He is alert and oriented to person, place, and time.  Skin: Skin is warm and  dry. He is not diaphoretic.  Psychiatric: He has a normal mood and affect. His behavior is normal.    ED Course  Procedures (including critical care time)  COORDINATION OF CARE: 3:27 PM-Discussed treatment plan which includes cough medication, mucinex and salt water gargles with pt at bedside and pt agreed to plan.   Labs Review Labs Reviewed  RAPID STREP SCREEN  CULTURE, GROUP A STREP    Imaging Review No results found.   EKG Interpretation None      MDM   Final diagnoses:  URI (upper respiratory infection)  Elevated blood pressure reading   Pt with negative rapid strep. Patients symptoms are consistent with URI, likely viral etiology. Discussed that antibiotics are not indicated for viral infections. Pt will be discharged with symptomatic treatment.  Verbalizes understanding and is agreeable with plan. Pt is hemodynamically stable & in NAD prior to dc.  Meds given in ED:  Medications - No data to display  New Prescriptions   BENZONATATE (TESSALON) 100 MG CAPSULE    Take 1 capsule (100 mg total) by mouth every 8 (eight) hours.   DEXTROMETHORPHAN-GUAIFENESIN (MUCINEX DM) 30-600 MG PER 12 HR TABLET    Take 1 tablet by mouth 2 (two) times daily.   HYDROCODONE-ACETAMINOPHEN (HYCET) 7.5-325 MG/15 ML SOLUTION    Take 15 mLs by mouth every 4 (four) hours as needed for moderate pain or severe pain.      I personally performed the services described in this documentation, which was scribed in my presence. The recorded information has been reviewed and is accurate.  [name], PA-C 10/21/13 [phone]

## 2013-10-21 NOTE — ED Notes (Signed)
Pt c/o sore throat with cough and body aches x 2 days worse today

## 2013-10-22 NOTE — ED Provider Notes (Signed)
Medical screening examination/treatment/procedure(s) were performed by non-physician practitioner and as supervising physician I was immediately available for consultation/collaboration.   EKG Interpretation None        Wandra Arthurs, MD 10/22/13 1053

## 2013-10-23 LAB — CULTURE, GROUP A STREP

## 2014-09-14 ENCOUNTER — Emergency Department (HOSPITAL_COMMUNITY): Payer: No Typology Code available for payment source

## 2014-09-14 ENCOUNTER — Emergency Department (HOSPITAL_COMMUNITY)
Admission: EM | Admit: 2014-09-14 | Discharge: 2014-09-14 | Disposition: A | Discharge status: Home / Self Care | Payer: No Typology Code available for payment source | Attending: Emergency Medicine | Admitting: Emergency Medicine

## 2014-09-14 ENCOUNTER — Encounter (HOSPITAL_COMMUNITY): Payer: Self-pay | Admitting: Emergency Medicine

## 2014-09-14 DIAGNOSIS — Y9389 Activity, other specified: Secondary | ICD-10-CM | POA: Diagnosis not present

## 2014-09-14 DIAGNOSIS — S39012A Strain of muscle, fascia and tendon of lower back, initial encounter: Secondary | ICD-10-CM | POA: Diagnosis not present

## 2014-09-14 DIAGNOSIS — Y92481 Parking lot as the place of occurrence of the external cause: Secondary | ICD-10-CM | POA: Diagnosis not present

## 2014-09-14 DIAGNOSIS — Z79899 Other long term (current) drug therapy: Secondary | ICD-10-CM | POA: Insufficient documentation

## 2014-09-14 DIAGNOSIS — Y998 Other external cause status: Secondary | ICD-10-CM | POA: Insufficient documentation

## 2014-09-14 DIAGNOSIS — S3992XA Unspecified injury of lower back, initial encounter: Secondary | ICD-10-CM | POA: Diagnosis present

## 2014-09-14 MED ORDER — NAPROXEN 500 MG PO TABS: 500.0000 mg | ORAL_TABLET | Freq: Two times a day (BID) | ORAL | Status: DC

## 2014-09-14 MED ORDER — CYCLOBENZAPRINE HCL 10 MG PO TABS: 10.0000 mg | ORAL_TABLET | Freq: Two times a day (BID) | ORAL | Status: DC | PRN

## 2014-09-14 MED ORDER — TRAMADOL HCL 50 MG PO TABS: 50.0000 mg | ORAL_TABLET | Freq: Four times a day (QID) | ORAL | Status: DC | PRN

## 2014-09-14 NOTE — Discharge Instructions (Signed)
Naprosyn for pain and inflammation. Tramadol for severe pain. Flexeril for spasms. No heavy lifting for 1 week. Follow up with your doctor  Back Pain, Adult Low back pain is very common. About 1 in 5 people have back pain.The cause of low back pain is rarely dangerous. The pain often gets better over time.About half of people with a sudden onset of back pain feel better in just 2 weeks. About 8 in 10 people feel better by 6 weeks.  CAUSES Some common causes of back pain include:  Strain of the muscles or ligaments supporting the spine.  Wear and tear (degeneration) of the spinal discs.  Arthritis.  Direct injury to the back. DIAGNOSIS Most of the time, the direct cause of low back pain is not known.However, back pain can be treated effectively even when the exact cause of the pain is unknown.Answering your caregiver's questions about your overall health and symptoms is one of the most accurate ways to make sure the cause of your pain is not dangerous. If your caregiver needs more information, he or she may order lab work or imaging tests (X-rays or MRIs).However, even if imaging tests show changes in your back, this usually does not require surgery. HOME CARE INSTRUCTIONS For many people, back pain returns.Since low back pain is rarely dangerous, it is often a condition that people can learn to [facility] their own.   Remain active. It is stressful on the back to sit or stand in one place. Do not sit, drive, or stand in one place for more than 30 minutes at a time. Take short walks on level surfaces as soon as pain allows.Try to increase the length of time you walk each day.  Do not stay in bed.Resting more than 1 or 2 days can delay your recovery.  Do not avoid exercise or work.Your body is made to move.It is not dangerous to be active, even though your back may hurt.Your back will likely heal faster if you return to being active before your pain is gone.  Pay attention to your  body when you bend and lift. Many people have less discomfortwhen lifting if they bend their knees, keep the load close to their bodies,and avoid twisting. Often, the most comfortable positions are those that put less stress on your recovering back.  Find a comfortable position to sleep. Use a firm mattress and lie on your side with your knees slightly bent. If you lie on your back, put a pillow under your knees.  Only take over-the-counter or prescription medicines as directed by your caregiver. Over-the-counter medicines to reduce pain and inflammation are often the most helpful.Your caregiver may prescribe muscle relaxant drugs.These medicines help dull your pain so you can more quickly return to your normal activities and healthy exercise.  Put ice on the injured area.  Put ice in a plastic bag.  Place a towel between your skin and the bag.  Leave the ice on for 15-20 minutes, 03-04 times a day for the first 2 to 3 days. After that, ice and heat may be alternated to reduce pain and spasms.  Ask your caregiver about trying back exercises and gentle massage. This may be of some benefit.  Avoid feeling anxious or stressed.Stress increases muscle tension and can worsen back pain.It is important to recognize when you are anxious or stressed and learn ways to manage it.Exercise is a great option. SEEK MEDICAL CARE IF:  You have pain that is not relieved with rest or medicine.  You have pain that does not improve in 1 week.  You have new symptoms.  You are generally not feeling well. SEEK IMMEDIATE MEDICAL CARE IF:   You have pain that radiates from your back into your legs.  You develop new bowel or bladder control problems.  You have unusual weakness or numbness in your arms or legs.  You develop nausea or vomiting.  You develop abdominal pain.  You feel faint. Document Released: 02/20/2005 Document Revised: 08/22/2011 Document Reviewed: 06/24/2013 [facility] Patient  Information 2015 [place], [place]. This information is not intended to replace advice given to you by your health care provider. Make sure you discuss any questions you have with your health care provider.   Back Exercises Back exercises help treat and prevent back injuries. The goal of back exercises is to increase the strength of your abdominal and back muscles and the flexibility of your back. These exercises should be started when you no longer have back pain. Back exercises include:  Pelvic Tilt. Lie on your back with your knees bent. Tilt your pelvis until the lower part of your back is against the floor. Hold this position 5 to 10 sec and repeat 5 to 10 times.  Knee to Chest. Pull first 1 knee up against your chest and hold for 20 to 30 seconds, repeat this with the other knee, and then both knees. This may be done with the other leg straight or bent, whichever feels better.  Sit-Ups or Curl-Ups. Bend your knees 90 degrees. Start with tilting your pelvis, and do a partial, slow sit-up, lifting your trunk only 30 to 45 degrees off the floor. Take at least 2 to 3 seconds for each sit-up. Do not do sit-ups with your knees out straight. If partial sit-ups are difficult, simply do the above but with only tightening your abdominal muscles and holding it as directed.  Hip-Lift. Lie on your back with your knees flexed 90 degrees. Push down with your feet and shoulders as you raise your hips a couple inches off the floor; hold for 10 seconds, repeat 5 to 10 times.  Back arches. Lie on your stomach, propping yourself up on bent elbows. Slowly press on your hands, causing an arch in your low back. Repeat 3 to 5 times. Any initial stiffness and discomfort should lessen with repetition over time.  Shoulder-Lifts. Lie face down with arms beside your body. Keep hips and torso pressed to floor as you slowly lift your head and shoulders off the floor. Do not overdo your exercises, especially in the beginning.  Exercises may cause you some mild back discomfort which lasts for a few minutes; however, if the pain is more severe, or lasts for more than 15 minutes, do not continue exercises until you see your caregiver. Improvement with exercise therapy for back problems is slow.  See your caregivers for assistance with developing a proper back exercise program. Document Released: 03/30/2004 Document Revised: 05/15/2011 Document Reviewed: 12/22/2010 [facility] Patient Information 2015 [place], [place]. This information is not intended to replace advice given to you by your health care provider. Make sure you discuss any questions you have with your health care provider.

## 2014-09-14 NOTE — ED Notes (Signed)
Declined W/C at D/C and was escorted to lobby by RN. 
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
## 2014-09-16 ENCOUNTER — Encounter (HOSPITAL_COMMUNITY): Payer: Self-pay | Admitting: Family Medicine

## 2014-09-16 ENCOUNTER — Emergency Department (HOSPITAL_COMMUNITY)
Admission: EM | Admit: 2014-09-16 | Discharge: 2014-09-16 | Disposition: A | Discharge status: Home / Self Care | Payer: No Typology Code available for payment source | Attending: Emergency Medicine | Admitting: Emergency Medicine

## 2014-09-16 DIAGNOSIS — Z791 Long term (current) use of non-steroidal anti-inflammatories (NSAID): Secondary | ICD-10-CM | POA: Insufficient documentation

## 2014-09-16 DIAGNOSIS — Z79899 Other long term (current) drug therapy: Secondary | ICD-10-CM | POA: Insufficient documentation

## 2014-09-16 DIAGNOSIS — Z87828 Personal history of other (healed) physical injury and trauma: Secondary | ICD-10-CM | POA: Insufficient documentation

## 2014-09-16 DIAGNOSIS — Z0289 Encounter for other administrative examinations: Secondary | ICD-10-CM | POA: Insufficient documentation
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
## 2014-09-16 NOTE — ED Notes (Signed)
Pt here for to be released back to work. No complaints.
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
## 2015-09-14 ENCOUNTER — Encounter (HOSPITAL_COMMUNITY): Payer: Self-pay | Admitting: Emergency Medicine

## 2015-09-14 ENCOUNTER — Emergency Department (HOSPITAL_COMMUNITY): Payer: No Typology Code available for payment source

## 2015-09-14 ENCOUNTER — Emergency Department (HOSPITAL_COMMUNITY)
Admission: EM | Admit: 2015-09-14 | Discharge: 2015-09-14 | Disposition: A | Discharge status: Home / Self Care | Payer: Self-pay | Attending: Emergency Medicine | Admitting: Emergency Medicine

## 2015-09-14 DIAGNOSIS — R05 Cough: Secondary | ICD-10-CM | POA: Insufficient documentation

## 2015-09-14 DIAGNOSIS — R079 Chest pain, unspecified: Secondary | ICD-10-CM | POA: Insufficient documentation

## 2015-09-14 DIAGNOSIS — J45909 Unspecified asthma, uncomplicated: Secondary | ICD-10-CM | POA: Insufficient documentation

## 2015-09-14 DIAGNOSIS — Z5321 Procedure and treatment not carried out due to patient leaving prior to being seen by health care provider: Secondary | ICD-10-CM | POA: Insufficient documentation

## 2015-09-14 HISTORY — DX: Unspecified asthma, uncomplicated: J45.909

## 2015-09-14 LAB — BASIC METABOLIC PANEL
ANION GAP: 9 (ref 5–15)
BUN: 13 mg/dL (ref 6–20)
CHLORIDE: 108 mmol/L (ref 101–111)
CO2: 21 mmol/L — AB (ref 22–32)
Calcium: 9.3 mg/dL (ref 8.9–10.3)
Creatinine, Ser: 1.01 mg/dL (ref 0.61–1.24)
GFR calc Af Amer: >60 mL/min (ref >60)
GFR calc non Af Amer: >60 mL/min (ref >60)
GLUCOSE: 97 mg/dL (ref 65–99)
POTASSIUM: 3.7 mmol/L (ref 3.5–5.1)
Sodium: 138 mmol/L (ref 135–145)

## 2015-09-14 LAB — CBC
HEMATOCRIT: 39.9 % (ref 39.0–52.0)
HEMOGLOBIN: 13.7 g/dL (ref 13.0–17.0)
MCH: 33.2 pg (ref 26.0–34.0)
MCHC: 34.3 g/dL (ref 30.0–36.0)
MCV: 96.6 fL (ref 78.0–100.0)
Platelets: 276 10*3/uL (ref 150–400)
RBC: 4.13 MIL/uL — ABNORMAL LOW (ref 4.22–5.81)
RDW: 12.1 % (ref 11.5–15.5)
WBC: 6.1 10*3/uL (ref 4.0–10.5)

## 2015-09-14 LAB — I-STAT TROPONIN, ED: Troponin i, poc: 0 ng/mL (ref 0.00–0.08)

## 2015-09-14 NOTE — ED Notes (Signed)
Pt states he has been having a productive cough for the last 1 week with some soreness in his ribs. Pt states last night he started having a sharp pain in the left side of chest. Pt coughing in triage, denies sob, pt in no distress. 100% on room air.

## 2015-09-14 NOTE — ED Notes (Signed)
No answer when patient was called several times to be taken back to a room

## 2015-09-14 NOTE — ED Notes (Signed)
Attempted to call several times with no answer.

## 2015-09-15 NOTE — ED Provider Notes (Signed)
After review of notes, it appears patient left without being seen by a provider.   Merrily Pew, MD 09/15/15 [phone]

## 2017-05-23 ENCOUNTER — Encounter (HOSPITAL_COMMUNITY): Payer: Self-pay | Admitting: Emergency Medicine

## 2017-05-23 ENCOUNTER — Emergency Department (HOSPITAL_COMMUNITY)
Admission: EM | Admit: 2017-05-23 | Discharge: 2017-05-23 | Disposition: A | Discharge status: Home / Self Care | Payer: Self-pay | Attending: Emergency Medicine | Admitting: Emergency Medicine

## 2017-05-23 ENCOUNTER — Other Ambulatory Visit: Payer: Self-pay

## 2017-05-23 DIAGNOSIS — R197 Diarrhea, unspecified: Secondary | ICD-10-CM | POA: Insufficient documentation

## 2017-05-23 DIAGNOSIS — R112 Nausea with vomiting, unspecified: Secondary | ICD-10-CM | POA: Insufficient documentation

## 2017-05-23 DIAGNOSIS — R030 Elevated blood-pressure reading, without diagnosis of hypertension: Secondary | ICD-10-CM | POA: Insufficient documentation

## 2017-05-23 DIAGNOSIS — I159 Secondary hypertension, unspecified: Secondary | ICD-10-CM

## 2017-05-23 DIAGNOSIS — J45909 Unspecified asthma, uncomplicated: Secondary | ICD-10-CM | POA: Insufficient documentation

## 2017-05-23 LAB — COMPREHENSIVE METABOLIC PANEL
ALK PHOS: 46 U/L (ref 38–126)
ALT: 18 U/L (ref 17–63)
AST: 23 U/L (ref 15–41)
Albumin: 4.7 g/dL (ref 3.5–5.0)
Anion gap: 13 (ref 5–15)
BILIRUBIN TOTAL: 1.9 mg/dL — AB (ref 0.3–1.2)
BUN: 21 mg/dL — AB (ref 6–20)
CALCIUM: 10.1 mg/dL (ref 8.9–10.3)
CO2: 21 mmol/L — ABNORMAL LOW (ref 22–32)
CREATININE: 1.07 mg/dL (ref 0.61–1.24)
Chloride: 106 mmol/L (ref 101–111)
GFR calc Af Amer: >60 mL/min (ref >60)
Glucose, Bld: 171 mg/dL — ABNORMAL HIGH (ref 65–99)
POTASSIUM: 3.7 mmol/L (ref 3.5–5.1)
Sodium: 140 mmol/L (ref 135–145)
TOTAL PROTEIN: 8.3 g/dL — AB (ref 6.5–8.1)

## 2017-05-23 LAB — URINALYSIS, ROUTINE W REFLEX MICROSCOPIC
BILIRUBIN URINE: NEGATIVE
GLUCOSE, UA: NEGATIVE mg/dL
KETONES UR: NEGATIVE mg/dL
Leukocytes, UA: NEGATIVE
NITRITE: NEGATIVE
PROTEIN: 100 mg/dL — AB
Specific Gravity, Urine: 1.031 — ABNORMAL HIGH (ref 1.005–1.030)
Squamous Epithelial / LPF: NONE SEEN
pH: 5 (ref 5.0–8.0)

## 2017-05-23 LAB — CBC
HCT: 47.8 % (ref 39.0–52.0)
Hemoglobin: 16.5 g/dL (ref 13.0–17.0)
MCH: 34 pg (ref 26.0–34.0)
MCHC: 34.5 g/dL (ref 30.0–36.0)
MCV: 98.6 fL (ref 78.0–100.0)
PLATELETS: 269 10*3/uL (ref 150–400)
RBC: 4.85 MIL/uL (ref 4.22–5.81)
RDW: 13.1 % (ref 11.5–15.5)
WBC: 9.7 10*3/uL (ref 4.0–10.5)

## 2017-05-23 LAB — LIPASE, BLOOD: LIPASE: 36 U/L (ref 11–51)

## 2017-05-23 MED ORDER — ONDANSETRON 4 MG PO TBDP
4.0000 mg | ORAL_TABLET | Freq: Once | ORAL | Status: AC | PRN
  Administered 2017-05-23: 4 mg via ORAL
  Filled 2017-05-23: qty 1

## 2017-05-23 MED ORDER — SODIUM CHLORIDE 0.9 % IV BOLUS (SEPSIS)
1000.0000 mL | Freq: Once | INTRAVENOUS | Status: AC
  Administered 2017-05-23: 1000 mL via INTRAVENOUS

## 2017-05-23 MED ORDER — PROMETHAZINE HCL 25 MG/ML IJ SOLN
12.5000 mg | Freq: Once | INTRAMUSCULAR | Status: AC
  Administered 2017-05-23: 12.5 mg via INTRAVENOUS
  Filled 2017-05-23: qty 1

## 2017-05-23 MED ORDER — TRIAMTERENE-HCTZ 37.5-25 MG PO TABS: 1.0000 | ORAL_TABLET | Freq: Every day | ORAL | 0 refills | Status: DC

## 2017-05-23 MED ORDER — PROMETHAZINE HCL 25 MG PO TABS: 25.0000 mg | ORAL_TABLET | Freq: Four times a day (QID) | ORAL | 0 refills | Status: DC | PRN

## 2017-05-23 NOTE — ED Provider Notes (Signed)
[facility] EMERGENCY DEPARTMENT Provider Note   CSN: [id number] Arrival date & time: 05/23/17  8032     History   Chief Complaint Chief Complaint  Patient presents with  . Diarrhea    HPI Nicholas Richmond is a 57 y.o. male.  HPI Nicholas Richmond is a 57 y.o. male with history of asthma, presents to emergency department with complaint of nausea, vomiting, diarrhea.  Patient states his symptoms began yesterday.  He reports back pain, reports multiple episodes of diarrhea, reports associated nausea and vomiting.  He denies any abdominal pain.  Denies any fever or chills.  States his sister was sick with the same last week and he took her to the hospital.  No urinary symptoms.  Did not try any medications prior to coming in.  Denies any upper respiratory symptoms.  Nothing making his symptoms better or worse. No other complaints.   Past Medical History:  Diagnosis Date  . Asthma     There are no active problems to display for this patient.   History reviewed. No pertinent surgical history.     Home Medications    Prior to Admission medications   Medication Sig Start Date End Date Taking? Authorizing Provider  benzonatate (TESSALON) 100 MG capsule Take 1 capsule (100 mg total) by mouth every 8 (eight) hours. 10/21/13   [name], PA-C  cyclobenzaprine (FLEXERIL) 10 MG tablet Take 1 tablet (10 mg total) by mouth 2 (two) times daily as needed for muscle spasms. 09/14/14   Jatavion Peaster, Lahoma Rocker, PA-C  dextromethorphan-guaiFENesin (MUCINEX DM) 30-600 MG per 12 hr tablet Take 1 tablet by mouth 2 (two) times daily. 10/21/13   [name], PA-C  HYDROcodone-acetaminophen (HYCET) 7.5-325 mg/15 ml solution Take 15 mLs by mouth every 4 (four) hours as needed for moderate pain or severe pain. 10/21/13   [name], PA-C  naproxen (NAPROSYN) 500 MG tablet Take 1 tablet (500 mg total) by mouth 2 (two) times daily. 09/14/14   Nioka Thorington, PA-C  traMADol (ULTRAM) 50  MG tablet Take 1 tablet (50 mg total) by mouth every 6 (six) hours as needed. 09/14/14   Jeannett Senior, PA-C    Family History No family history on file.  Social History Social History   Tobacco Use  . Smoking status: Never Smoker  . Smokeless tobacco: Never Used  Substance Use Topics  . Alcohol use: Yes    Comment: occasional  . Drug use: Not on file     Allergies   Patient has no known allergies.   Review of Systems Review of Systems  Constitutional: Negative for chills and fever.  HENT: Negative for congestion and sore throat.   Respiratory: Negative for cough, chest tightness and shortness of breath.   Cardiovascular: Negative for chest pain, palpitations and leg swelling.  Gastrointestinal: Positive for diarrhea, nausea and vomiting. Negative for abdominal distention and abdominal pain.  Genitourinary: Negative for dysuria, frequency, hematuria and urgency.  Musculoskeletal: Positive for back pain and myalgias. Negative for arthralgias, neck pain and neck stiffness.  Skin: Negative for rash.  Allergic/Immunologic: Negative for immunocompromised state.  Neurological: Negative for dizziness, weakness, light-headedness, numbness and headaches.  All other systems reviewed and are negative.    Physical Exam Updated Vital Signs BP (!) 196/118 (BP Location: Right Arm)   Pulse 85   Temp 98.5 F (36.9 C) (Oral)   Resp 17   Ht 5\' 11"  (1.803 m)   Wt 79.4 kg (175 lb)   SpO2 99%  BMI 24.41 kg/m   Physical Exam  Constitutional: He appears well-developed and well-nourished. No distress.  HENT:  Head: Normocephalic and atraumatic.  Eyes: Conjunctivae are normal.  Neck: Neck supple.  Cardiovascular: Normal rate, regular rhythm and normal heart sounds.  Pulmonary/Chest: Effort normal. No respiratory distress. He has no wheezes. He has no rales.  Abdominal: Soft. Bowel sounds are normal. He exhibits no distension. There is no tenderness. There is no rebound.    Musculoskeletal: He exhibits no edema.  Neurological: He is alert.  Skin: Skin is warm and dry.  Nursing note and vitals reviewed.    ED Treatments / Results  Labs (all labs ordered are listed, but only abnormal results are displayed) Labs Reviewed  COMPREHENSIVE METABOLIC PANEL - Abnormal; Notable for the following components:      Result Value   CO2 21 (*)    Glucose, Bld 171 (*)    BUN 21 (*)    Total Protein 8.3 (*)    Total Bilirubin 1.9 (*)    All other components within normal limits  URINALYSIS, ROUTINE W REFLEX MICROSCOPIC - Abnormal; Notable for the following components:   Specific Gravity, Urine 1.031 (*)    Hgb urine dipstick MODERATE (*)    Protein, ur 100 (*)    Bacteria, UA FEW (*)    All other components within normal limits  CBC  LIPASE, BLOOD    EKG  EKG Interpretation None       Radiology No results found.  Procedures Procedures (including critical care time)  Medications Ordered in ED Medications  sodium chloride 0.9 % bolus 1,000 mL (not administered)  promethazine (PHENERGAN) injection 12.5 mg (not administered)  sodium chloride 0.9 % bolus 1,000 mL (not administered)  ondansetron (ZOFRAN-ODT) disintegrating tablet 4 mg (4 mg Oral Given 05/23/17 0451)     Initial Impression / Assessment and Plan / ED Course  I have reviewed the triage vital signs and the nursing notes.  Pertinent labs & imaging results that were available during my care of the patient were reviewed by me and considered in my medical decision making (see chart for details).     Patient with nausea, vomiting, diarrhea, back pain onset yesterday.  Patient is actively vomiting during my exam.  He already received Zofran in triage.  He is also complaining of the back pain.  Labs obtained, unremarkable, will add lipase.  Urinalysis showing moderate hemoglobin, too numerous to count red blood cells, suspicious for possible kidney stone in setting of severe back pain and  vomiting.  Will give IV fluids, antiemetics, will monitor.  11:33 AM Pt feels much better. His pain and nausea resolved. Tolerating POs. BP high in ED. No hx of htn. Asymptomatic for his BP.  Plan to dc home, will give Maxzide, follow up with pcp in 1 week for recheck.  Return precautions discussed.   Vitals:   05/23/17 0446 05/23/17 0447 05/23/17 0733 05/23/17 1131  BP: (!) 171/133 (!) 201/120 (!) 196/118 (!) 218/113  Pulse: 67  85 75  Resp: 16  17 17   Temp: 98.5 F (36.9 C)     TempSrc: Oral     SpO2: 100%  99% 100%  Weight:  79.4 kg (175 lb)    Height:  5\' 11"  (1.803 m)       Final Clinical Impressions(s) / ED Diagnoses   Final diagnoses:  Nausea vomiting and diarrhea  Secondary hypertension    ED Discharge Orders  Ordered    triamterene-hydrochlorothiazide (MAXZIDE-25) 37.5-25 MG tablet  Daily     05/23/17 1131    promethazine (PHENERGAN) 25 MG tablet  Every 6 hours PRN     05/23/17 1131       Jeannett Senior, PA-C 05/23/17 1134    Jola Schmidt, MD 05/23/17 1620

## 2017-05-23 NOTE — Discharge Instructions (Signed)
Phenergan for nausea and vomiting. Drink plenty of fluids. Take blood pressure medication as prescribed. Your BP is very high in ED, please see your family doctor in 1 week.

## 2017-05-23 NOTE — ED Triage Notes (Signed)
Pt reports diarrhea, vomiting and generalized body aches for over 24 hours. Hypertensive, vomiting in triage.

## 2017-07-06 ENCOUNTER — Ambulatory Visit (HOSPITAL_COMMUNITY)
Admission: EM | Admit: 2017-07-06 | Discharge: 2017-07-06 | Disposition: A | Discharge status: Home / Self Care | Payer: PRIVATE HEALTH INSURANCE | Attending: Family Medicine | Admitting: Family Medicine

## 2017-07-06 ENCOUNTER — Encounter (HOSPITAL_COMMUNITY): Payer: Self-pay | Admitting: Emergency Medicine

## 2017-07-06 DIAGNOSIS — M79602 Pain in left arm: Secondary | ICD-10-CM

## 2017-07-06 DIAGNOSIS — I10 Essential (primary) hypertension: Secondary | ICD-10-CM | POA: Diagnosis not present

## 2017-07-06 HISTORY — DX: Essential (primary) hypertension: I10

## 2017-07-06 NOTE — ED Provider Notes (Signed)
[facility]    CSN: [id number] Arrival date & time: 07/06/17  1240     History   Chief Complaint Chief Complaint  Patient presents with  . Hypertension    HPI Nicholas Richmond is a 57 y.o. male.   HPI  Patient thinks that he is having trouble with his blood pressure medicine because he has been having an increase in muscle cramps, especially at night He was told by coworkers that that medication can cause cramping.  I told him that lisinopril does not but the hydrochlorothiazide can cause electrolyte deficiencies.  We discussed the importance of eating potassium foods while on hydrochlorothiazide.  We discussed checking a potassium today but he is scheduled to see his primary care doctor for complete physical tomorrow. He did not sleep well last night because of the muscle cramps, a lot of this morning feeling out of sorts.  He took his blood pressure he states it was 167/109.  At that time he had some left arm pain.  He is never had any heart disease but has some concerns regarding his heart.  He had no diaphoresis or nausea.  No real chest pressure pain.  He states that he can exert himself with no chest pain.  Never had any heart or lung disease.  He is a known smoker.   Past Medical History:  Diagnosis Date  . Asthma   . Hypertension     There are no active problems to display for this patient.   History reviewed. No pertinent surgical history.     Home Medications    Prior to Admission medications   Medication Sig Start Date End Date Taking? Authorizing Provider  lisinopril-hydrochlorothiazide (PRINZIDE,ZESTORETIC) 20-25 MG tablet Take 1 tablet by mouth daily.   Yes [provider]  promethazine (PHENERGAN) 25 MG tablet Take 1 tablet (25 mg total) by mouth every 6 (six) hours as needed for nausea or vomiting. 05/23/17   Jeannett Senior, PA-C    Family History No family history on file.  Social History Social History   Tobacco Use  .  Smoking status: Never Smoker  . Smokeless tobacco: Never Used  Substance Use Topics  . Alcohol use: Yes    Comment: occasional  . Drug use: Not on file     Allergies   Patient has no known allergies.   Review of Systems Review of Systems  Constitutional: Negative for chills and fever.  HENT: Negative for congestion, dental problem, ear pain and sore throat.   Eyes: Negative for pain and visual disturbance.  Respiratory: Negative for cough and shortness of breath.   Cardiovascular: Positive for chest pain. Negative for palpitations.  Gastrointestinal: Negative for abdominal pain and vomiting.  Genitourinary: Negative for dysuria and hematuria.  Musculoskeletal: Negative for arthralgias and back pain.  Skin: Negative for color change and rash.  Neurological: Positive for numbness. Negative for syncope and headaches.       Left arm pain, some numbness in fingers  All other systems reviewed and are negative.    Physical Exam Triage Vital Signs ED Triage Vitals  Enc Vitals Group     BP 07/06/17 1308 116/83     Pulse Rate 07/06/17 1308 (!) 111     Resp 07/06/17 1308 18     Temp 07/06/17 1308 98.6 F (37 C)     Temp src --      SpO2 07/06/17 1308 100 %     Weight --      Height --  Head Circumference --      Peak Flow --      Pain Score 07/06/17 1512 0     Pain Loc --      Pain Edu? --      Excl. in [place]? --    No data found.  Updated Vital Signs BP 116/83   Pulse (!) 111   Temp 98.6 F (37 C)   Resp 18   SpO2 100%      Physical Exam  Constitutional: He is oriented to person, place, and time. He appears well-developed and well-nourished.  HENT:  Head: Normocephalic and atraumatic.  Right Ear: External ear normal.  Left Ear: External ear normal.  Mouth/Throat: Oropharynx is clear and moist.  Eyes: Pupils are equal, round, and reactive to light. Conjunctivae are normal.  Neck: Normal range of motion. Neck supple.  Cardiovascular: Normal rate, regular  rhythm and normal heart sounds.  No murmur heard. Pulmonary/Chest: Effort normal and breath sounds normal. No respiratory distress.  Abdominal: Soft. There is no tenderness.  Musculoskeletal: Normal range of motion. He exhibits no edema.  Neck is full range of motion.  Spurling's is negative.  Reflexes strength and sensation are intact in both upper extremities  Lymphadenopathy:    He has no cervical adenopathy.  Neurological: He is alert and oriented to person, place, and time.  Phalen's and reverse Phalen's both cause mild increase in numb sensation per second third and fourth fingers.  Spares fifth finger.  Grip strength is good.  Thenar eminence preserved.  Skin: Skin is warm and dry.  Psychiatric: He has a normal mood and affect.  Nursing note and vitals reviewed.    UC Treatments / Results  Labs (all labs ordered are listed, but only abnormal results are displayed) Labs Reviewed - No data to display  EKG EKG has early LVH, some nonspecific ST /T wave changes.  No acute findings.  It is compared to a EKG in 2017 and these findings are new   Initial Impression / Assessment and Plan / UC Course  I have reviewed the triage vital signs and the nursing notes.  Pertinent labs & imaging results that were available during my care of the patient were reviewed by me and considered in my medical decision making (see chart for details).     Gust with patient that LVH can come from untreated hypertension.  He was only recently diagnosed with hypertension and placed on medication.  Appears he may have had it for some time.  His chest pain does not seem cardiac.  Physical examination is normal.  I did advise him to follow-up with his family doctor tomorrow given a copy of his EKG to go with him.  He has physical exam findings are suspicious for carpal tunnel syndrome.  He can also follow this up with his primary Final Clinical Impressions(s) / UC Diagnoses   Final diagnoses:  Essential  hypertension  Left arm pain     Discharge Instructions     Continue BP medicine Get OJ or bananas for potasium See your PCP tomorrow as scheduled Wear wrist brace at night  Return here as needed     ED Prescriptions    None     Controlled Substance Prescriptions [place] Controlled Substance Registry consulted? Not Applicable   Raylene Everts, MD 07/06/17 Joen Laura

## 2017-07-06 NOTE — Discharge Instructions (Signed)
Continue BP medicine Get OJ or bananas for potasium See your PCP tomorrow as scheduled Wear wrist brace at night  Return here as needed

## 2017-07-06 NOTE — ED Triage Notes (Signed)
Pt states his BP was 167/109

## 2017-07-06 NOTE — ED Triage Notes (Signed)
Pt states he took his BP this morning and it was high, is supposed to be taking lisinopril but states it gives him cramps. Has appt with PCP tomorrow to get medication changed.

## 2017-11-13 DIAGNOSIS — I10 Essential (primary) hypertension: Secondary | ICD-10-CM | POA: Insufficient documentation

## 2017-11-13 DIAGNOSIS — D171 Benign lipomatous neoplasm of skin and subcutaneous tissue of trunk: Secondary | ICD-10-CM | POA: Insufficient documentation

## 2017-12-24 DIAGNOSIS — R7303 Prediabetes: Secondary | ICD-10-CM | POA: Insufficient documentation

## 2018-02-05 ENCOUNTER — Encounter: Payer: Self-pay | Admitting: Gastroenterology

## 2018-03-11 ENCOUNTER — Ambulatory Visit (AMBULATORY_SURGERY_CENTER): Payer: PRIVATE HEALTH INSURANCE

## 2018-03-11 VITALS — Ht 71.0 in | Wt 176.4 lb

## 2018-03-11 DIAGNOSIS — Z1211 Encounter for screening for malignant neoplasm of colon: Secondary | ICD-10-CM

## 2018-03-11 MED ORDER — NA SULFATE-K SULFATE-MG SULF 17.5-3.13-1.6 GM/177ML PO SOLN: 1.0000 | Freq: Once | ORAL | 0 refills | Status: AC

## 2018-03-11 NOTE — Progress Notes (Signed)
Denies allergies to eggs or soy products. Denies complication of anesthesia or sedation. Denies use of weight loss medication. Denies use of O2.   Emmi instructions declined.   A 15.00 coupon for Suprep was given to the patient.

## 2018-03-13 ENCOUNTER — Encounter: Payer: Self-pay | Admitting: Gastroenterology

## 2018-03-25 ENCOUNTER — Ambulatory Visit (AMBULATORY_SURGERY_CENTER): Payer: PRIVATE HEALTH INSURANCE | Admitting: Gastroenterology

## 2018-03-25 ENCOUNTER — Encounter: Payer: Self-pay | Admitting: Gastroenterology

## 2018-03-25 VITALS — BP 169/113 | HR 78 | Temp 98.0°F | Resp 13 | Ht 71.0 in | Wt 176.0 lb

## 2018-03-25 DIAGNOSIS — Z1211 Encounter for screening for malignant neoplasm of colon: Secondary | ICD-10-CM

## 2018-03-25 DIAGNOSIS — K635 Polyp of colon: Secondary | ICD-10-CM | POA: Diagnosis not present

## 2018-03-25 DIAGNOSIS — D123 Benign neoplasm of transverse colon: Secondary | ICD-10-CM

## 2018-03-25 MED ORDER — SODIUM CHLORIDE 0.9 % IV SOLN: 500.0000 mL | Freq: Once | INTRAVENOUS | Status: DC

## 2018-03-25 NOTE — Progress Notes (Signed)
Patient with elevated blood pressure. Patient not taking prescribed blood pressure medicine. Instructed to pick prescription up and take medication. Also instructed to see primary care doctor. Verbalizes understanding and doctor aware.

## 2018-03-25 NOTE — Op Note (Signed)
[name] Patient Name: Nicholas Richmond Procedure Date: 03/25/2018 8:30 AM MRN: [id number] Endoscopist: Remo Lipps P. Havery Moros , MD Age: 58 Referring MD:  Date of Birth: 1960-09-09 Gender: Male Account #: [id number] Procedure:                Colonoscopy Indications:              Screening for colorectal malignant neoplasm, This                            is the patient's first colonoscopy Medicines:                Monitored Anesthesia Care Procedure:                Pre-Anesthesia Assessment:                           - Prior to the procedure, a History and Physical                            was performed, and patient medications and                            allergies were reviewed. The patient's tolerance of                            previous anesthesia was also reviewed. The risks                            and benefits of the procedure and the sedation                            options and risks were discussed with the patient.                            All questions were answered, and informed consent                            was obtained. Prior Anticoagulants: The patient has                            taken no previous anticoagulant or antiplatelet                            agents. ASA Grade Assessment: II - A patient with                            mild systemic disease. After reviewing the risks                            and benefits, the patient was deemed in                            satisfactory condition to undergo the procedure.  After obtaining informed consent, the colonoscope                            was passed under direct vision. Throughout the                            procedure, the patient's blood pressure, pulse, and                            oxygen saturations were monitored continuously. The                            Colonoscope was introduced through the anus and                            advanced to the the  cecum, identified by                            appendiceal orifice and ileocecal valve. The                            colonoscopy was performed without difficulty. The                            patient tolerated the procedure well. The quality                            of the bowel preparation was good. The ileocecal                            valve, appendiceal orifice, and rectum were                            photographed. Scope In: 8:35:37 AM Scope Out: 8:53:07 AM Scope Withdrawal Time: 0 hours 14 minutes 52 seconds  Total Procedure Duration: 0 hours 17 minutes 30 seconds  Findings:                 Grade III hemorrhoids were found on perianal exam.                           A 3 mm polyp was found in the transverse colon. The                            polyp was sessile. The polyp was removed with a                            cold snare. Resection and retrieval were complete.                           A single small-mouthed diverticulum was found in                            the transverse colon.  Internal hemorrhoids were found during                            retroflexion. The hemorrhoids were large.                           The exam was otherwise without abnormality. Complications:            No immediate complications. Estimated blood loss:                            Minimal. Estimated Blood Loss:     Estimated blood loss was minimal. Impression:               - Hemorrhoids found on perianal exam.                           - One 3 mm polyp in the transverse colon, removed                            with a cold snare. Resected and retrieved.                           - Diverticulosis in the transverse colon.                           - Internal hemorrhoids.                           - The examination was otherwise normal. Recommendation:           - Patient has a contact number available for                            emergencies. The signs and  symptoms of potential                            delayed complications were discussed with the                            patient. Return to normal activities tomorrow.                            Written discharge instructions were provided to the                            patient.                           - Resume previous diet.                           - Await pathology results.                           - Continue present medications.                           -  Consideration for hemorrhoid banding if symptoms                            persist Tikita Mabee P. Havery Moros, MD 03/25/2018 8:58:51 AM This report has been signed electronically.

## 2018-03-25 NOTE — Progress Notes (Signed)
Called to room to assist during endoscopic procedure.  Patient ID and intended procedure confirmed with present staff. Received instructions for my participation in the procedure from the performing physician.  

## 2018-03-25 NOTE — Progress Notes (Signed)
Report to PACU, RN, vss, BBS= Clear.  

## 2018-03-25 NOTE — Patient Instructions (Signed)
Discharge instructions given. Handouts on polyps,diverticulosis and hemorrhoids. Resume previous medications. YOU HAD AN ENDOSCOPIC PROCEDURE TODAY AT THE [facility]:   Refer to the procedure report that was given to you for any specific questions about what was found during the examination.  If the procedure report does not answer your questions, please call your gastroenterologist to clarify.  If you requested that your care partner not be given the details of your procedure findings, then the procedure report has been included in a sealed envelope for you to review at your convenience later.  YOU SHOULD EXPECT: Some feelings of bloating in the abdomen. Passage of more gas than usual.  Walking can help get rid of the air that was put into your GI tract during the procedure and reduce the bloating. If you had a lower endoscopy (such as a colonoscopy or flexible sigmoidoscopy) you may notice spotting of blood in your stool or on the toilet paper. If you underwent a bowel prep for your procedure, you may not have a normal bowel movement for a few days.  Please Note:  You might notice some irritation and congestion in your nose or some drainage.  This is from the oxygen used during your procedure.  There is no need for concern and it should clear up in a day or so.  SYMPTOMS TO REPORT IMMEDIATELY:   Following lower endoscopy (colonoscopy or flexible sigmoidoscopy):  Excessive amounts of blood in the stool  Significant tenderness or worsening of abdominal pains  Swelling of the abdomen that is new, acute  Fever of 100F or higher   For urgent or emergent issues, a gastroenterologist can be reached at any hour by calling [phone].   DIET:  We do recommend a small meal at first, but then you may proceed to your regular diet.  Drink plenty of fluids but you should avoid alcoholic beverages for 24 hours.  ACTIVITY:  You should plan to take it easy for the rest of today and you  should NOT DRIVE or use heavy machinery until tomorrow (because of the sedation medicines used during the test).    FOLLOW UP: Our staff will call the number listed on your records the next business day following your procedure to check on you and address any questions or concerns that you may have regarding the information given to you following your procedure. If we do not reach you, we will leave a message.  However, if you are feeling well and you are not experiencing any problems, there is no need to return our call.  We will assume that you have returned to your regular daily activities without incident.  If any biopsies were taken you will be contacted by phone or by letter within the next 1-3 weeks.  Please call us at [phone] if you have not heard about the biopsies in 3 weeks.    SIGNATURES/CONFIDENTIALITY: You and/or your care partner have signed paperwork which will be entered into your electronic medical record.  These signatures attest to the fact that that the information above on your After Visit Summary has been reviewed and is understood.  Full responsibility of the confidentiality of this discharge information lies with you and/or your care-partner. 

## 2018-03-26 ENCOUNTER — Telehealth: Payer: Self-pay

## 2018-03-26 NOTE — Telephone Encounter (Signed)
2nd follow up phone call attempt, no answer, left message.

## 2018-03-26 NOTE — Telephone Encounter (Signed)
-----   Message from Yetta Flock, MD sent at 03/25/2018 11:44 AM EST ----- Regarding: banding Hey [name], Can you help book this patient for a routine hemorrhoid banding?  Thanks much

## 2018-03-26 NOTE — Telephone Encounter (Signed)
No answer, left message to call back later today, B.Shyanna Klingel RN. 

## 2018-03-26 NOTE — Telephone Encounter (Signed)
Called and LM for pt that we have scheduled him for his 1st hemorrhoidal banding appt on Tuesday, 04-02-18 at 4:00pm.  Left number to call back if that is not convenient.

## 2018-03-28 ENCOUNTER — Telehealth: Payer: Self-pay | Admitting: Gastroenterology

## 2018-03-28 NOTE — Telephone Encounter (Signed)
Pt call and advised that he wasn't aware that this has been sched. For 04/02/2018 and want to speak with the nurse possibly rescheduling as well.

## 2018-03-28 NOTE — Telephone Encounter (Signed)
Rescheduled pt for first banding appt on Feb 19th as he indicated he had some other medical issues he wanted to address before then.

## 2018-04-02 ENCOUNTER — Encounter: Payer: PRIVATE HEALTH INSURANCE | Admitting: Gastroenterology

## 2018-04-02 ENCOUNTER — Encounter: Payer: Self-pay | Admitting: Gastroenterology

## 2018-04-24 ENCOUNTER — Encounter: Payer: PRIVATE HEALTH INSURANCE | Admitting: Gastroenterology

## 2018-04-24 NOTE — Telephone Encounter (Signed)
Lm for pt to call back to confirm his interest in keeping his banding appts. He no-showed his 1st banding appt today, which had been rescheduled from January.

## 2018-05-09 ENCOUNTER — Telehealth: Payer: Self-pay | Admitting: Gastroenterology

## 2018-05-09 NOTE — Telephone Encounter (Signed)
Pt reported that he is having rectal bleeding.  He requested a CB.

## 2018-05-09 NOTE — Telephone Encounter (Signed)
Pt has rectal bleeding from his hemorrhoids. He has a scheduled appt and will keep it on 05/14/18 for banding.

## 2018-05-14 ENCOUNTER — Encounter: Payer: PRIVATE HEALTH INSURANCE | Admitting: Gastroenterology

## 2018-05-28 ENCOUNTER — Encounter: Payer: PRIVATE HEALTH INSURANCE | Admitting: Gastroenterology

## 2018-06-10 ENCOUNTER — Encounter: Payer: PRIVATE HEALTH INSURANCE | Admitting: Gastroenterology

## 2018-06-18 ENCOUNTER — Encounter: Payer: PRIVATE HEALTH INSURANCE | Admitting: Gastroenterology

## 2019-07-03 DIAGNOSIS — T50Z95S Adverse effect of other vaccines and biological substances, sequela: Secondary | ICD-10-CM | POA: Insufficient documentation

## 2019-10-02 ENCOUNTER — Emergency Department (HOSPITAL_COMMUNITY)
Admission: EM | Admit: 2019-10-02 | Discharge: 2019-10-03 | Disposition: A | Discharge status: Home / Self Care | Payer: 59 | Attending: Emergency Medicine | Admitting: Emergency Medicine

## 2019-10-02 ENCOUNTER — Encounter (HOSPITAL_COMMUNITY): Payer: Self-pay | Admitting: Emergency Medicine

## 2019-10-02 ENCOUNTER — Other Ambulatory Visit: Payer: Self-pay

## 2019-10-02 DIAGNOSIS — I10 Essential (primary) hypertension: Secondary | ICD-10-CM | POA: Diagnosis not present

## 2019-10-02 DIAGNOSIS — J45909 Unspecified asthma, uncomplicated: Secondary | ICD-10-CM | POA: Diagnosis not present

## 2019-10-02 DIAGNOSIS — R0602 Shortness of breath: Secondary | ICD-10-CM | POA: Diagnosis not present

## 2019-10-02 DIAGNOSIS — Z20822 Contact with and (suspected) exposure to covid-19: Secondary | ICD-10-CM

## 2019-10-02 DIAGNOSIS — Z79899 Other long term (current) drug therapy: Secondary | ICD-10-CM | POA: Insufficient documentation

## 2019-10-02 DIAGNOSIS — R197 Diarrhea, unspecified: Secondary | ICD-10-CM | POA: Diagnosis not present

## 2019-10-02 DIAGNOSIS — R509 Fever, unspecified: Secondary | ICD-10-CM | POA: Diagnosis not present

## 2019-10-02 DIAGNOSIS — M791 Myalgia, unspecified site: Secondary | ICD-10-CM | POA: Diagnosis not present

## 2019-10-02 LAB — COMPREHENSIVE METABOLIC PANEL
ALT: 17 U/L (ref 0–44)
AST: 21 U/L (ref 15–41)
Albumin: 3.6 g/dL (ref 3.5–5.0)
Alkaline Phosphatase: 43 U/L (ref 38–126)
Anion gap: 9 (ref 5–15)
BUN: 21 mg/dL — ABNORMAL HIGH (ref 6–20)
CO2: 23 mmol/L (ref 22–32)
Calcium: 9 mg/dL (ref 8.9–10.3)
Chloride: 105 mmol/L (ref 98–111)
Creatinine, Ser: 1.21 mg/dL (ref 0.61–1.24)
GFR calc Af Amer: >60 mL/min (ref >60)
GFR calc non Af Amer: >60 mL/min (ref >60)
Glucose, Bld: 123 mg/dL — ABNORMAL HIGH (ref 70–99)
Potassium: 3.4 mmol/L — ABNORMAL LOW (ref 3.5–5.1)
Sodium: 137 mmol/L (ref 135–145)
Total Bilirubin: 1.2 mg/dL (ref 0.3–1.2)
Total Protein: 6.4 g/dL — ABNORMAL LOW (ref 6.5–8.1)

## 2019-10-02 LAB — URINALYSIS, ROUTINE W REFLEX MICROSCOPIC
Bacteria, UA: NONE SEEN
Bilirubin Urine: NEGATIVE
Glucose, UA: NEGATIVE mg/dL
Ketones, ur: 5 mg/dL — AB
Leukocytes,Ua: NEGATIVE
Nitrite: NEGATIVE
Protein, ur: NEGATIVE mg/dL
Specific Gravity, Urine: 1.028 (ref 1.005–1.030)
pH: 5 (ref 5.0–8.0)

## 2019-10-02 LAB — CBC
HCT: 37.4 % — ABNORMAL LOW (ref 39.0–52.0)
Hemoglobin: 12.6 g/dL — ABNORMAL LOW (ref 13.0–17.0)
MCH: 33.7 pg (ref 26.0–34.0)
MCHC: 33.7 g/dL (ref 30.0–36.0)
MCV: 100 fL (ref 80.0–100.0)
Platelets: 255 10*3/uL (ref 150–400)
RBC: 3.74 MIL/uL — ABNORMAL LOW (ref 4.22–5.81)
RDW: 11.9 % (ref 11.5–15.5)
WBC: 12.2 10*3/uL — ABNORMAL HIGH (ref 4.0–10.5)
nRBC: 0 % (ref 0.0–0.2)

## 2019-10-02 LAB — LIPASE, BLOOD: Lipase: 35 U/L (ref 11–51)

## 2019-10-02 LAB — SARS CORONAVIRUS 2 BY RT PCR (HOSPITAL ORDER, PERFORMED IN ~~LOC~~ HOSPITAL LAB): SARS Coronavirus 2: NEGATIVE

## 2019-10-02 MED ORDER — ACETAMINOPHEN 500 MG PO TABS
1000.0000 mg | ORAL_TABLET | Freq: Once | ORAL | Status: AC
  Administered 2019-10-02: 1000 mg via ORAL
  Filled 2019-10-02: qty 2

## 2019-10-02 NOTE — ED Triage Notes (Signed)
Pt reports fever, diarrhea, loss of taste since this morning. Has had two doses of covid vaccine.

## 2019-10-03 ENCOUNTER — Emergency Department (HOSPITAL_COMMUNITY): Payer: 59

## 2019-10-03 LAB — GASTROINTESTINAL PANEL BY PCR, STOOL (REPLACES STOOL CULTURE)

## 2019-10-03 MED ORDER — SODIUM CHLORIDE 0.9 % IV BOLUS
1000.0000 mL | Freq: Once | INTRAVENOUS | Status: AC
  Administered 2019-10-03: 1000 mL via INTRAVENOUS

## 2019-10-03 MED ORDER — LOPERAMIDE HCL 2 MG PO CAPS
4.0000 mg | ORAL_CAPSULE | Freq: Once | ORAL | Status: AC
  Administered 2019-10-03: 4 mg via ORAL
  Filled 2019-10-03: qty 2

## 2019-10-03 MED ORDER — ONDANSETRON 4 MG PO TBDP
ORAL_TABLET | ORAL | 0 refills | Status: DC
Start: 2019-10-03 — End: 2022-07-25

## 2019-10-03 MED ORDER — POTASSIUM CHLORIDE CRYS ER 20 MEQ PO TBCR
40.0000 meq | EXTENDED_RELEASE_TABLET | Freq: Once | ORAL | Status: AC
  Administered 2019-10-03: 40 meq via ORAL
  Filled 2019-10-03: qty 2

## 2019-10-03 NOTE — ED Provider Notes (Signed)
[facility] Provider Note   CSN: [id number] Arrival date & time: 10/02/19  1303     History Chief Complaint  Patient presents with  . Diarrhea    Rankin Coolman is a 59 y.o. male with a hx of asthma, HTN presents to the Emergency Department complaining of gradual, persistent, progressively worsening COVID-19 symptoms onset this afternoon. Associated symptoms include loss of taste/smell, nasal congestion, cough, shortness of breath, myalgias and diarrhea.  Pt reports subjective fever at home and was found to have a temp of 101.8 on arrival to the ED.  Pt reports watery diarrhea with intermittent streaks of blood.  Pt also reports large hemorrhoid which he has had for some time.  No increase in rectal bleeding.  Pt reports he is fully vaccinated for COVID, however his wife was diagnosed with COVID last week with symptoms.  Pt also reports a number of his coworkers have been sick.  No treatments PTA.  Nothing makes his symptoms better or worse.  Pt denies headache, neck pain, neck stiffness, chest pain, abdominal pain, weakness, syncope, dysuria.       The history is provided by the patient and medical records. No language interpreter was used.       Past Medical History:  Diagnosis Date  . Asthma   . Hypertension     There are no problems to display for this patient.   History reviewed. No pertinent surgical history.     Family History  Problem Relation Age of Onset  . Colon cancer Neg Hx   . Esophageal cancer Neg Hx   . Rectal cancer Neg Hx   . Stomach cancer Neg Hx     Social History   Tobacco Use  . Smoking status: Never Smoker  . Smokeless tobacco: Never Used  Substance Use Topics  . Alcohol use: Yes    Comment: occasional  . Drug use: Not Currently    Home Medications Prior to Admission medications   Medication Sig Start Date End Date Taking? Authorizing Provider  lisinopril-hydrochlorothiazide (PRINZIDE,ZESTORETIC)  20-25 MG tablet Take 1 tablet by mouth daily.    [provider]  ondansetron (ZOFRAN ODT) 4 MG disintegrating tablet 4mg  ODT q4 hours prn nausea/vomit 10/03/19   Keelyn Monjaras, Jarrett Soho, PA-C    Allergies    Patient has no known allergies.  Review of Systems   Review of Systems  Constitutional: Positive for appetite change, chills and fever. Negative for diaphoresis, fatigue and unexpected weight change.  HENT: Positive for congestion. Negative for mouth sores.        Loss of taste/smell  Eyes: Negative for visual disturbance.  Respiratory: Positive for cough and shortness of breath. Negative for chest tightness and wheezing.   Cardiovascular: Negative for chest pain.  Gastrointestinal: Positive for diarrhea. Negative for abdominal pain, constipation, nausea and vomiting.  Endocrine: Negative for polydipsia, polyphagia and polyuria.  Genitourinary: Negative for dysuria, frequency, hematuria and urgency.  Musculoskeletal: Positive for myalgias. Negative for back pain and neck stiffness.  Skin: Negative for rash.  Allergic/Immunologic: Negative for immunocompromised state.  Neurological: Negative for syncope, light-headedness and headaches.  Hematological: Does not bruise/bleed easily.  Psychiatric/Behavioral: Negative for sleep disturbance. The patient is not nervous/anxious.     Physical Exam Updated Vital Signs BP (!) 145/99 (BP Location: Right Arm)   Pulse 69   Temp 98.2 F (36.8 C) (Oral)   Resp 18   Ht 5\' 11"  (1.803 m)   Wt 79.4 kg   SpO2  100%   BMI 24.41 kg/m   Physical Exam Vitals and nursing note reviewed.  Constitutional:      General: He is not in acute distress.    Appearance: He is not diaphoretic.  HENT:     Head: Normocephalic.  Eyes:     General: No scleral icterus.    Conjunctiva/sclera: Conjunctivae normal.  Cardiovascular:     Rate and Rhythm: Normal rate and regular rhythm.     Pulses: Normal pulses.          Radial pulses are 2+ on the  right side and 2+ on the left side.  Pulmonary:     Effort: No tachypnea, accessory muscle usage, prolonged expiration, respiratory distress or retractions.     Breath sounds: No stridor.     Comments: Equal chest rise. No increased work of breathing. Abdominal:     General: There is no distension.     Palpations: Abdomen is soft.     Tenderness: There is no abdominal tenderness. There is no guarding or rebound.  Musculoskeletal:     Cervical back: Normal range of motion.     Comments: Moves all extremities equally and without difficulty.  Skin:    General: Skin is warm and dry.     Capillary Refill: Capillary refill takes less than 2 seconds.  Neurological:     Mental Status: He is alert.     GCS: GCS eye subscore is 4. GCS verbal subscore is 5. GCS motor subscore is 6.     Comments: Speech is clear and goal oriented.  Psychiatric:        Mood and Affect: Mood normal.     ED Results / Procedures / Treatments   Labs (all labs ordered are listed, but only abnormal results are displayed) Labs Reviewed  COMPREHENSIVE METABOLIC PANEL - Abnormal; Notable for the following components:      Result Value   Potassium 3.4 (*)    Glucose, Bld 123 (*)    BUN 21 (*)    Total Protein 6.4 (*)    All other components within normal limits  CBC - Abnormal; Notable for the following components:   WBC 12.2 (*)    RBC 3.74 (*)    Hemoglobin 12.6 (*)    HCT 37.4 (*)    All other components within normal limits  URINALYSIS, ROUTINE W REFLEX MICROSCOPIC - Abnormal; Notable for the following components:   Hgb urine dipstick MODERATE (*)    Ketones, ur 5 (*)    All other components within normal limits  SARS CORONAVIRUS 2 BY RT PCR (HOSPITAL ORDER, [facility])  GASTROINTESTINAL PANEL BY PCR, STOOL (REPLACES STOOL CULTURE)  LIPASE, BLOOD    Radiology DG Chest Portable 1 View  Result Date: 10/03/2019 CLINICAL DATA:  Shortness of breath EXAM: PORTABLE CHEST 1 VIEW  COMPARISON:  September 14, 2015 FINDINGS: The heart size and mediastinal contours are within normal limits. Both lungs are clear. The visualized skeletal structures are unremarkable. IMPRESSION: No active disease. Electronically Signed   By: Prudencio Pair M.D.   On: 10/03/2019 00:18    Procedures Procedures (including critical care time)  Medications Ordered in ED Medications  acetaminophen (TYLENOL) tablet 1,000 mg (1,000 mg Oral Given 10/02/19 1338)  sodium chloride 0.9 % bolus 1,000 mL (0 mLs Intravenous Stopped 10/03/19 0155)  potassium chloride SA (KLOR-CON) CR tablet 40 mEq (40 mEq Oral Given 10/03/19 0043)  loperamide (IMODIUM) capsule 4 mg (4 mg Oral Given  10/03/19 0146)    ED Course  I have reviewed the triage vital signs and the nursing notes.  Pertinent labs & imaging results that were available during my care of the patient were reviewed by me and considered in my medical decision making (see chart for details).  Clinical Course as of Oct 02 541  Fri Oct 03, 2019  0100 leukocytosis  WBC(!): 12.2 [HM]  0100 Mild hypokalemia, oral replacement  Potassium(!): 3.4 [HM]  0100 Noted  SARS Coronavirus 2: NEGATIVE [HM]    Clinical Course User Index [HM] Gaston Dase, Gwenlyn Perking   MDM Rules/Calculators/A&P                           Nishaan Stanke was evaluated in Emergency Department on 10/03/2019 for the symptoms described in the history of present illness. He was evaluated in the context of the global COVID-19 pandemic, which necessitated consideration that the patient might be at risk for infection with the SARS-CoV-2 virus that causes COVID-19. Institutional protocols and algorithms that pertain to the evaluation of patients at risk for COVID-19 are in a state of rapid change based on information released by regulatory bodies including the CDC and federal and state organizations. These policies and algorithms were followed during the patient's care in the ED.  Pt presents with COVID  exposure and COVID like illness.  Diarrhea.  GI panel pending.  Mild leukocytosis and fever.  Pt given fluids, potassium.  Declines pain control at this time.    2:30 AM Pt reports he is feeling much better.  Will be d/c home with conservative therapies and close PCP follow-up.  Discussed reasons to return to the ED.  Pt states understanding and is in agreement with the plan.     Final Clinical Impression(s) / ED Diagnoses Final diagnoses:  Diarrhea of presumed infectious origin  Suspected COVID-19 virus infection    Rx / DC Orders ED Discharge Orders         Ordered    ondansetron (ZOFRAN ODT) 4 MG disintegrating tablet     Discontinue  Reprint     10/03/19 0250           Quincey Nored, Jarrett Soho, PA-C 10/03/19 [place], April, MD 10/03/19 [id number]

## 2019-10-03 NOTE — Discharge Instructions (Signed)
1. Medications: Zofran for nausea/vomiting, over-the-counter Imodium for diarrhea, alternate tylenol and ibuprofen for fever control, continue usual home medications 2. Treatment: rest, drink plenty of fluids, isolate for the next 10 days 3. Follow Up: Please followup with your primary doctor if your symptoms are not improving after 10-14 days; Please return to the ER for high fevers, persistent vomiting, shortness of breath or other concerns.

## 2019-12-23 DIAGNOSIS — K760 Fatty (change of) liver, not elsewhere classified: Secondary | ICD-10-CM | POA: Insufficient documentation

## 2020-02-21 ENCOUNTER — Ambulatory Visit: Payer: PRIVATE HEALTH INSURANCE

## 2020-03-26 ENCOUNTER — Encounter (HOSPITAL_COMMUNITY): Payer: Self-pay | Admitting: Emergency Medicine

## 2020-03-26 ENCOUNTER — Emergency Department (HOSPITAL_COMMUNITY): Payer: Self-pay

## 2020-03-26 ENCOUNTER — Emergency Department (HOSPITAL_COMMUNITY)
Admission: EM | Admit: 2020-03-26 | Discharge: 2020-03-27 | Disposition: A | Discharge status: Home / Self Care | Payer: Self-pay | Attending: Emergency Medicine | Admitting: Emergency Medicine

## 2020-03-26 DIAGNOSIS — R404 Transient alteration of awareness: Secondary | ICD-10-CM | POA: Insufficient documentation

## 2020-03-26 DIAGNOSIS — F1990 Other psychoactive substance use, unspecified, uncomplicated: Secondary | ICD-10-CM

## 2020-03-26 DIAGNOSIS — Z79899 Other long term (current) drug therapy: Secondary | ICD-10-CM | POA: Insufficient documentation

## 2020-03-26 DIAGNOSIS — I10 Essential (primary) hypertension: Secondary | ICD-10-CM | POA: Insufficient documentation

## 2020-03-26 DIAGNOSIS — F1910 Other psychoactive substance abuse, uncomplicated: Secondary | ICD-10-CM | POA: Insufficient documentation

## 2020-03-26 DIAGNOSIS — R918 Other nonspecific abnormal finding of lung field: Secondary | ICD-10-CM | POA: Insufficient documentation

## 2020-03-26 DIAGNOSIS — J45909 Unspecified asthma, uncomplicated: Secondary | ICD-10-CM | POA: Insufficient documentation

## 2020-03-26 LAB — CBC WITH DIFFERENTIAL/PLATELET
Abs Immature Granulocytes: 0.12 10*3/uL — ABNORMAL HIGH (ref 0.00–0.07)
Basophils Absolute: 0 10*3/uL (ref 0.0–0.1)
Basophils Relative: 0 %
Eosinophils Absolute: 0 10*3/uL (ref 0.0–0.5)
Eosinophils Relative: 0 %
HCT: 42 % (ref 39.0–52.0)
Hemoglobin: 14.2 g/dL (ref 13.0–17.0)
Immature Granulocytes: 1 %
Lymphocytes Relative: 3 %
Lymphs Abs: 0.4 10*3/uL — ABNORMAL LOW (ref 0.7–4.0)
MCH: 34.2 pg — ABNORMAL HIGH (ref 26.0–34.0)
MCHC: 33.8 g/dL (ref 30.0–36.0)
MCV: 101.2 fL — ABNORMAL HIGH (ref 80.0–100.0)
Monocytes Absolute: 1.4 10*3/uL — ABNORMAL HIGH (ref 0.1–1.0)
Monocytes Relative: 9 %
Neutro Abs: 12.6 10*3/uL — ABNORMAL HIGH (ref 1.7–7.7)
Neutrophils Relative %: 87 %
Platelets: 322 10*3/uL (ref 150–400)
RBC: 4.15 MIL/uL — ABNORMAL LOW (ref 4.22–5.81)
RDW: 13.9 % (ref 11.5–15.5)
WBC: 14.6 10*3/uL — ABNORMAL HIGH (ref 4.0–10.5)
nRBC: 0 % (ref 0.0–0.2)

## 2020-03-26 LAB — COMPREHENSIVE METABOLIC PANEL
ALT: 65 U/L — ABNORMAL HIGH (ref 0–44)
AST: 67 U/L — ABNORMAL HIGH (ref 15–41)
Albumin: 4.3 g/dL (ref 3.5–5.0)
Alkaline Phosphatase: 50 U/L (ref 38–126)
Anion gap: 13 (ref 5–15)
BUN: 22 mg/dL — ABNORMAL HIGH (ref 6–20)
CO2: 20 mmol/L — ABNORMAL LOW (ref 22–32)
Calcium: 9.2 mg/dL (ref 8.9–10.3)
Chloride: 106 mmol/L (ref 98–111)
Creatinine, Ser: 1.45 mg/dL — ABNORMAL HIGH (ref 0.61–1.24)
GFR, Estimated: 56 mL/min — ABNORMAL LOW (ref >60)
Glucose, Bld: 139 mg/dL — ABNORMAL HIGH (ref 70–99)
Potassium: 5.7 mmol/L — ABNORMAL HIGH (ref 3.5–5.1)
Sodium: 139 mmol/L (ref 135–145)
Total Bilirubin: 1.3 mg/dL — ABNORMAL HIGH (ref 0.3–1.2)
Total Protein: 7.5 g/dL (ref 6.5–8.1)

## 2020-03-26 LAB — I-STAT CHEM 8, ED
BUN: 26 mg/dL — ABNORMAL HIGH (ref 6–20)
Calcium, Ion: 1.08 mmol/L — ABNORMAL LOW (ref 1.15–1.40)
Chloride: 108 mmol/L (ref 98–111)
Creatinine, Ser: 1.1 mg/dL (ref 0.61–1.24)
Glucose, Bld: 89 mg/dL (ref 70–99)
HCT: 42 % (ref 39.0–52.0)
Hemoglobin: 14.3 g/dL (ref 13.0–17.0)
Potassium: 4.9 mmol/L (ref 3.5–5.1)
Sodium: 139 mmol/L (ref 135–145)
TCO2: 24 mmol/L (ref 22–32)

## 2020-03-26 MED ORDER — IOHEXOL 300 MG/ML  SOLN
100.0000 mL | Freq: Once | INTRAMUSCULAR | Status: AC | PRN
  Administered 2020-03-26: 100 mL via INTRAVENOUS

## 2020-03-26 MED ORDER — LISINOPRIL 20 MG PO TABS
20.0000 mg | ORAL_TABLET | Freq: Once | ORAL | Status: AC
  Administered 2020-03-27: 20 mg via ORAL
  Filled 2020-03-26: qty 1

## 2020-03-26 MED ORDER — LACTATED RINGERS IV BOLUS
1000.0000 mL | Freq: Once | INTRAVENOUS | Status: AC
  Administered 2020-03-26: 1000 mL via INTRAVENOUS

## 2020-03-26 MED ORDER — SODIUM ZIRCONIUM CYCLOSILICATE 5 G PO PACK
5.0000 g | PACK | Freq: Every day | ORAL | Status: DC
  Administered 2020-03-26: 5 g via ORAL
  Filled 2020-03-26: qty 1

## 2020-03-26 NOTE — Discharge Instructions (Signed)
Follow-up with your primary care physician to discuss your kidney function and potassium.  Your potassium was mildly elevated and you were given medications to help bring that down.  I recommend that you see your primary care physician in the next few days to have this rechecked and ensure that it is not elevated again.  Please attempt to abstain from drug use, resources are available.  Return to the emergency department if you develop any chest pain, shortness of breath, fevers, vomiting, or any other emergent concern.

## 2020-03-26 NOTE — ED Provider Notes (Signed)
[facility] Provider Note   CSN: [id number] Arrival date & time: 03/26/20  1555     History Chief Complaint  Patient presents with  . Altered Mental Status    Nicholas SalviaMelvin Tavano is a 60 y.o. male.  The history is provided by the patient.  Ingestion This is a new problem. The current episode started less than 1 hour ago. The problem occurs constantly. The problem has not changed since onset.Pertinent negatives include no chest pain, no abdominal pain and no shortness of breath. Nothing aggravates the symptoms. Nothing relieves the symptoms. He has tried nothing for the symptoms.       Past Medical History:  Diagnosis Date  . Asthma   . Hypertension     There are no problems to display for this patient.   No past surgical history on file.     Family History  Problem Relation Age of Onset  . Colon cancer Neg Hx   . Esophageal cancer Neg Hx   . Rectal cancer Neg Hx   . Stomach cancer Neg Hx     Social History   Tobacco Use  . Smoking status: Never Smoker  . Smokeless tobacco: Never Used  Substance Use Topics  . Alcohol use: Yes    Alcohol/week: 1.0 standard drink    Types: 1 Cans of beer per week    Comment: occasional  . Drug use: Yes    Types: Cocaine, Marijuana    Home Medications Prior to Admission medications   Medication Sig Start Date End Date Taking? Authorizing Provider  lisinopril-hydrochlorothiazide (PRINZIDE,ZESTORETIC) 20-25 MG tablet Take 1 tablet by mouth daily.    [provider]  ondansetron (ZOFRAN ODT) 4 MG disintegrating tablet 4mg  ODT q4 hours prn nausea/vomit 10/03/19   Muthersbaugh, Dahlia ClientHannah, PA-C    Allergies    Patient has no known allergies.  Review of Systems   Review of Systems  Constitutional: Negative for chills and fever.       Mental status changes  HENT: Negative for ear pain and sore throat.   Eyes: Negative for pain and visual disturbance.  Respiratory: Negative for cough  and shortness of breath.   Cardiovascular: Negative for chest pain and palpitations.  Gastrointestinal: Negative for abdominal pain and vomiting.  Genitourinary: Negative for dysuria and hematuria.  Musculoskeletal: Negative for arthralgias and back pain.  Skin: Negative for color change and rash.  Neurological: Negative for seizures and syncope.  All other systems reviewed and are negative.   Physical Exam Updated Vital Signs BP (!) 168/120   Pulse 90   Temp 97.7 F (36.5 C) (Oral)   Resp 10   SpO2 97%   Physical Exam Vitals and nursing note reviewed.  Constitutional:      Appearance: He is well-developed and well-nourished.  HENT:     Head: Normocephalic and atraumatic.  Eyes:     Conjunctiva/sclera: Conjunctivae normal.     Comments: Pupils 2mm, bilaterally  Cardiovascular:     Rate and Rhythm: Normal rate and regular rhythm.     Heart sounds: No murmur heard.   Pulmonary:     Effort: Pulmonary effort is normal. No respiratory distress.     Breath sounds: Normal breath sounds.  Abdominal:     Palpations: Abdomen is soft.     Tenderness: There is no abdominal tenderness.  Musculoskeletal:        General: No edema.     Cervical back: Neck supple.  Skin:  General: Skin is warm and dry.  Neurological:     Mental Status: He is alert and easily aroused.     GCS: GCS eye subscore is 4. GCS verbal subscore is 5. GCS motor subscore is 6.     Cranial Nerves: Cranial nerves are intact.     Sensory: Sensation is intact.     Motor: Motor function is intact.     Coordination: Coordination is intact. Finger-Nose-Finger Test normal.     Comments: Sleepy, but arousable  Psychiatric:        Mood and Affect: Mood and affect normal.     ED Results / Procedures / Treatments   Labs (all labs ordered are listed, but only abnormal results are displayed) Labs Reviewed  CBC WITH DIFFERENTIAL/PLATELET - Abnormal; Notable for the following components:      Result Value   WBC  14.6 (*)    RBC 4.15 (*)    MCV 101.2 (*)    MCH 34.2 (*)    Neutro Abs 12.6 (*)    Lymphs Abs 0.4 (*)    Monocytes Absolute 1.4 (*)    Abs Immature Granulocytes 0.12 (*)    All other components within normal limits  COMPREHENSIVE METABOLIC PANEL - Abnormal; Notable for the following components:   Potassium 5.7 (*)    CO2 20 (*)    Glucose, Bld 139 (*)    BUN 22 (*)    Creatinine, Ser 1.45 (*)    AST 67 (*)    ALT 65 (*)    Total Bilirubin 1.3 (*)    GFR, Estimated 56 (*)    All other components within normal limits  I-STAT CHEM 8, ED - Abnormal; Notable for the following components:   BUN 26 (*)    Calcium, Ion 1.08 (*)    All other components within normal limits    EKG EKG Interpretation  Date/Time:  Friday March 26 2020 15:57:34 EST Ventricular Rate:  103 PR Interval:    QRS Duration: 92 QT Interval:  330 QTC Calculation: 432 R Axis:   59 Text Interpretation: Sinus tachycardia Probable left atrial enlargement Confirmed by Lacretia Leigh ([id number]) on 03/26/2020 4:09:40 PM   Radiology CT Chest W Contrast  Result Date: 03/26/2020 CLINICAL DATA:  Lung opacity, abnormal chest x-ray EXAM: CT CHEST WITH CONTRAST TECHNIQUE: Multidetector CT imaging of the chest was performed during intravenous contrast administration. CONTRAST:  119mL OMNIPAQUE IOHEXOL 300 MG/ML  SOLN COMPARISON:  Chest x-ray 03/26/2020 FINDINGS: Cardiovascular: The main pulmonary is enlarged measuring up to 3.5 cm. Preferential opacification of the main pulmonary artery. No filling defect to suggest central or segmental pulmonary embolus. No significant vascular findings. Normal heart size. No pericardial effusion. The thoracic aorta is normal in caliber. No coronary artery calcifications. Mediastinum/Nodes: No enlarged mediastinal, hilar, or axillary lymph nodes. Thyroid gland, trachea, and esophagus demonstrate no significant findings. Lungs/Pleura: Bilateral lower lobe subsegmental and linear atelectasis. No  focal consolidation. No pulmonary nodule. No pulmonary mass. No pleural effusion. No pneumothorax. Upper Abdomen: No acute abnormality. Musculoskeletal: No chest wall abnormality. No suspicious lytic or blastic osseous lesions. No acute displaced fracture. IMPRESSION: 1. No acute intrathoracic abnormality to explain chest x-ray finding. 2. Enlarged main pulmonary artery. Correlate with pulmonary hypertension. 3. No central or segmental pulmonary embolus. Electronically Signed   By: Iven Finn M.D.   On: 03/26/2020 19:20   DG Chest Portable 1 View  Result Date: 03/26/2020 CLINICAL DATA:  Crack cocaine use. EXAM: PORTABLE CHEST 1 VIEW  COMPARISON:  October 03, 2019. FINDINGS: Stable cardiac size. Right apical rounded density is noted. No pneumothorax or pleural effusion is noted. Left lung is clear. Bony thorax is unremarkable. IMPRESSION: Right apical rounded density is noted concerning for possible neoplasm. CT scan of the chest is recommended for further evaluation. Electronically Signed   By: Marijo Conception M.D.   On: 03/26/2020 16:29    Procedures Procedures (including critical care time)  Medications Ordered in ED Medications  sodium zirconium cyclosilicate (LOKELMA) packet 5 g (5 g Oral Given 03/26/20 1912)  lactated ringers bolus 1,000 mL (0 mLs Intravenous Stopped 03/26/20 2215)  iohexol (OMNIPAQUE) 300 MG/ML solution 100 mL (100 mLs Intravenous Contrast Given 03/26/20 1907)  lisinopril (ZESTRIL) tablet 20 mg (20 mg Oral Given 03/27/20 0014)    ED Course  I have reviewed the triage vital signs and the nursing notes.  Pertinent labs & imaging results that were available during my care of the patient were reviewed by me and considered in my medical decision making (see chart for details).    MDM Rules/Calculators/A&P                          This is a 60 year old male with a past medical history of hypertension, crack.  EKG showed, alcohol use, marijuana use, who presents to the  emergency department for evaluation of altered mental status.  EMS was called by a male friend after she awoke around 1505 and noticed the patient was unconscious, however breathing.  The patient reports that he had 1 beer yesterday, had 1 beer today, was also given something else to drink and after ingesting that he does not recall anything.  He does endorse that he is crack cocaine earlier today.  EMS arrived, pupils were pinpoint, patient was somnolent but arousable.  He has continued to improve from a mental status standpoint upon his arrival to the emergency department. Blood sugar 145.  He denies any chest pain, shortness of breath, headache, vomiting.  On arrival he is afebrile, hemodynamically stable, mildly hypertensive and tachycardic with heart rate in the low 100s.  He did have a brief episode of oxygen desaturation while monitored down to 86% but quickly rebounded and improved to oxygen saturations within normal limits with arousal.  The patient is able to tell me his name, where he is at, the current year, he is answering questions appropriately.  He denies any symptoms at this time.  Heart tachycardic, lungs are clear.  No facial asymmetry, equal strength and sensation with grip, dorsi and plantar flexion are equal bilaterally.  Chest x-ray obtained due to the desaturation episode did show potential apical consolidation of the right lung that could be concerning for malignancy, further work-up was initiated with basic labs of CBC and chemistry, CT of the chest with contrast was obtained.  Patient has not required any reversal medications such as Narcan while in the emergency department.  He was reevaluated and continues to improve from mentation standpoint.  He is still alert and oriented, able to provide more story this as he was with a girl that he does not hang out with very often and was given a drink that he is not sure what was in it.  He continues to be adamant that he is not using  any opiates, but does endorse the crack cocaine use.  EKG was obtained that showed sinus tachycardia with a rate of 103, normal axis, no significant ST  elevation or depression concerning for injury or ischemia.  No T wave inversions, intervals are within normal limits.  CXR showed opacity concerning for malignancy, therefore labs and CT chest obtained. CT chest shows no evidence of neoplasm. There is some dilation of pulmonary artery likely secondary to patient's known smoking hx.   CBC shows leukocytosis which is nonspecific, patient has no infectious symptoms of fever, cough, abdominal pain, dysuria. His chemistry does show potassium of 5.7, his EKG did not show any changes related to hyperkalemia.  This is likely secondary to his lisinopril that he is on chronically.  He was given a dose of Lokelma and a liter of fluids in the emergency department.  Was instructed to follow-up with his primary care physician to have this rechecked in the next few days to ensure that this is returning to a normal level.  His creatinine was elevated at 1.4, looking back at previous creatinines they have ranged from 1.1-1.5 at times, do not believe this to be AKI or a significant acute change.  Patient has returned to his baseline. We will repeat his potassium and chemistry to evaluate his creatinine and K+ following fluids and lokelma. This was signed out to the oncoming provider. I anticipate if his potassium and creatinine have improved that he is stable for discahrge with close follow-up. His altered mental status is likely from polysubstance abuse in which he admits to (alcohol intake, crack cocaine use, and ?unknown drink intake). He is ambulatory, stable, and does not appear clinically intoxicated at the end of my shift.    Final Clinical Impression(s) / ED Diagnoses Final diagnoses:  Transient alteration of awareness  Drug use disorder    Rx / DC Orders ED Discharge Orders    None       Camila Li, MD 03/27/20 8144    Lacretia Leigh, MD 03/28/20 2247

## 2020-03-26 NOTE — ED Provider Notes (Signed)
I saw and evaluated the patient, reviewed the resident's note and I agree with the findings and plan.  EKG: EKG Interpretation  Date/Time:  Friday March 26 2020 15:57:34 EST Ventricular Rate:  103 PR Interval:    QRS Duration: 92 QT Interval:  330 QTC Calculation: 432 R Axis:   59 Text Interpretation: Sinus tachycardia Probable left atrial enlargement Confirmed by Lacretia Leigh ([id number]) on 03/26/2020 4:09:40 PM   Is a 60 year old male with history of cocaine addiction presents with altered mental status. EMS called and patient's blood sugar 145. Does admit to cocaine use. Will work-up and monitor here   Lacretia Leigh, MD 03/26/20 1610

## 2020-03-27 NOTE — ED Notes (Signed)
Pt ambulated in hallway without difficulty

## 2020-09-23 ENCOUNTER — Emergency Department (HOSPITAL_COMMUNITY): Payer: PRIVATE HEALTH INSURANCE

## 2020-09-23 ENCOUNTER — Emergency Department (HOSPITAL_COMMUNITY)
Admission: EM | Admit: 2020-09-23 | Discharge: 2020-09-24 | Disposition: A | Discharge status: Home / Self Care | Payer: PRIVATE HEALTH INSURANCE | Attending: Emergency Medicine | Admitting: Emergency Medicine

## 2020-09-23 ENCOUNTER — Other Ambulatory Visit: Payer: Self-pay

## 2020-09-23 DIAGNOSIS — I10 Essential (primary) hypertension: Secondary | ICD-10-CM | POA: Insufficient documentation

## 2020-09-23 DIAGNOSIS — J45909 Unspecified asthma, uncomplicated: Secondary | ICD-10-CM | POA: Diagnosis not present

## 2020-09-23 DIAGNOSIS — F1290 Cannabis use, unspecified, uncomplicated: Secondary | ICD-10-CM | POA: Insufficient documentation

## 2020-09-23 DIAGNOSIS — R200 Anesthesia of skin: Secondary | ICD-10-CM | POA: Diagnosis present

## 2020-09-23 DIAGNOSIS — R202 Paresthesia of skin: Secondary | ICD-10-CM | POA: Diagnosis not present

## 2020-09-23 DIAGNOSIS — G459 Transient cerebral ischemic attack, unspecified: Secondary | ICD-10-CM | POA: Insufficient documentation

## 2020-09-23 DIAGNOSIS — Z79899 Other long term (current) drug therapy: Secondary | ICD-10-CM | POA: Diagnosis not present

## 2020-09-23 LAB — CBC WITH DIFFERENTIAL/PLATELET
Abs Immature Granulocytes: 0 10*3/uL (ref 0.00–0.07)
Basophils Absolute: 0 10*3/uL (ref 0.0–0.1)
Basophils Relative: 1 %
Eosinophils Absolute: 0.1 10*3/uL (ref 0.0–0.5)
Eosinophils Relative: 2 %
HCT: 40 % (ref 39.0–52.0)
Hemoglobin: 13.8 g/dL (ref 13.0–17.0)
Immature Granulocytes: 0 %
Lymphocytes Relative: 44 %
Lymphs Abs: 1.5 10*3/uL (ref 0.7–4.0)
MCH: 34 pg (ref 26.0–34.0)
MCHC: 34.5 g/dL (ref 30.0–36.0)
MCV: 98.5 fL (ref 80.0–100.0)
Monocytes Absolute: 0.3 10*3/uL (ref 0.1–1.0)
Monocytes Relative: 10 %
Neutro Abs: 1.4 10*3/uL — ABNORMAL LOW (ref 1.7–7.7)
Neutrophils Relative %: 43 %
Platelets: 289 10*3/uL (ref 150–400)
RBC: 4.06 MIL/uL — ABNORMAL LOW (ref 4.22–5.81)
RDW: 11.7 % (ref 11.5–15.5)
WBC: 3.3 10*3/uL — ABNORMAL LOW (ref 4.0–10.5)
nRBC: 0 % (ref 0.0–0.2)

## 2020-09-23 LAB — BASIC METABOLIC PANEL
Anion gap: 3 — ABNORMAL LOW (ref 5–15)
BUN: 16 mg/dL (ref 6–20)
CO2: 30 mmol/L (ref 22–32)
Calcium: 9.3 mg/dL (ref 8.9–10.3)
Chloride: 105 mmol/L (ref 98–111)
Creatinine, Ser: 1.2 mg/dL (ref 0.61–1.24)
GFR, Estimated: >60 mL/min (ref >60)
Glucose, Bld: 96 mg/dL (ref 70–99)
Potassium: 3.5 mmol/L (ref 3.5–5.1)
Sodium: 138 mmol/L (ref 135–145)

## 2020-09-23 LAB — TROPONIN I (HIGH SENSITIVITY)
Troponin I (High Sensitivity): 6 ng/L (ref <18)
Troponin I (High Sensitivity): 5 ng/L (ref <18)

## 2020-09-23 LAB — PROTIME-INR
INR: 1 (ref 0.8–1.2)
Prothrombin Time: 13.4 seconds (ref 11.4–15.2)

## 2020-09-23 NOTE — ED Triage Notes (Signed)
Pt states his left arm starting feeling numb around 1230. Decreased sensation on left. Denies any other symptoms.  Alert and oriented X4

## 2020-09-23 NOTE — ED Provider Notes (Signed)
Emergency Medicine Provider Triage Evaluation Note  Nicholas Richmond , a 60 y.o. male  was evaluated in triage.  Pt complains of presents with left arm paresthesias and weakness, started today around 12:30 PM, he denies headaches, change in vision, paresthesias Other 3 extremities, he denies recent head trauma, is not on anticoagulant, has never developed is depressed.  Does note that he had some left chest pain, and then he left arm numbness and weakness began.  He is never experienced in the past.  Review of Systems  Positive: Left arm weakness and numbness Negative: Headaches, fevers, chills  Physical Exam  BP (!) 171/116   Pulse 65   Temp 99 F (37.2 C) (Oral)   Resp 16   SpO2 100%  Gen:   Awake, no distress   Resp:  Normal effort  MSK:   Moves extremities without difficulty  Other:  No facial asymmetry, no difficulty with word finding, patient has slight decrease in grip strength on the left hand, no other unilateral weakness present, no gait disturbances.  Medical Decision Making  Medically screening exam initiated at 5:17 PM.  Appropriate orders placed.  Martie Fulgham was informed that the remainder of the evaluation will be completed by another provider, this initial triage assessment does not replace that evaluation, and the importance of remaining in the ED until their evaluation is complete.  Presents with left arm weakness, lab work and imaging have been ordered, patient will need further work-up.   Marcello Fennel, PA-C 09/23/20 1719    Milton Ferguson, MD 09/27/20 1102

## 2020-09-24 ENCOUNTER — Emergency Department (HOSPITAL_COMMUNITY): Payer: PRIVATE HEALTH INSURANCE

## 2020-09-24 NOTE — ED Notes (Signed)
Patient transported to MRI 

## 2020-09-24 NOTE — ED Provider Notes (Signed)
[facility] EMERGENCY DEPARTMENT Provider Note   CSN: [id number] Arrival date & time: 09/23/20  1427     History No chief complaint on file.   Nicholas Richmond is a 60 y.o. male.  Patient with history of hypertension presents the emergency department for evaluation of left arm numbness and tingling.  He states that around noon yesterday, while at work, he developed numbness of his left arm.  He was able to move his arm but did not try to pick anything up.  He thinks that he may have bit weak.  No swelling or injury to the shoulder.  No associated neck pain or shoulder pain.  He has never had anything like this in the past but does note that 3 days prior he had several hours of pressure in the left chest and some symptoms down the arm.  No associated headache. Patient denies other signs of stroke including: facial droop, slurred speech, aphasia, imbalance/trouble walking.  Admits to marijuana use, last on Monday. PCP in [place]. Takes BP meds in AM.        Past Medical History:  Diagnosis Date   Asthma    Hypertension     There are no problems to display for this patient.   No past surgical history on file.     Family History  Problem Relation Age of Onset   Colon cancer Neg Hx    Esophageal cancer Neg Hx    Rectal cancer Neg Hx    Stomach cancer Neg Hx     Social History   Tobacco Use   Smoking status: Never   Smokeless tobacco: Never  Substance Use Topics   Alcohol use: Yes    Alcohol/week: 1.0 standard drink    Types: 1 Cans of beer per week    Comment: occasional   Drug use: Yes    Types: Cocaine, Marijuana    Home Medications Prior to Admission medications   Medication Sig Start Date End Date Taking? Authorizing Provider  lisinopril-hydrochlorothiazide (PRINZIDE,ZESTORETIC) 20-25 MG tablet Take 1 tablet by mouth daily.    [provider]  ondansetron (ZOFRAN ODT) 4 MG disintegrating tablet '4mg'$  ODT q4 hours prn nausea/vomit  10/03/19   Muthersbaugh, Jarrett Soho, PA-C    Allergies    Patient has no known allergies.  Review of Systems   Review of Systems  Constitutional:  Negative for fever.  HENT:  Negative for rhinorrhea and sore throat.   Eyes:  Negative for redness.  Respiratory:  Negative for cough.   Cardiovascular:  Negative for chest pain.  Gastrointestinal:  Negative for abdominal pain, diarrhea, nausea and vomiting.  Genitourinary:  Negative for dysuria and hematuria.  Musculoskeletal:  Negative for myalgias.  Skin:  Negative for rash.  Neurological:  Positive for weakness and numbness. Negative for headaches.   Physical Exam Updated Vital Signs BP (!) 166/121   Pulse (!) 54   Temp 97.9 F (36.6 C) (Oral)   Resp 16   Ht '5\' 11"'$  (1.803 m)   Wt 81.2 kg   SpO2 100%   BMI 24.97 kg/m   Physical Exam Vitals and nursing note reviewed.  Constitutional:      General: He is not in acute distress.    Appearance: He is well-developed.  HENT:     Head: Normocephalic and atraumatic.     Right Ear: Tympanic membrane, ear canal and external ear normal.     Left Ear: Tympanic membrane, ear canal and external ear normal.  Nose: Nose normal.     Mouth/Throat:     Pharynx: Uvula midline.  Eyes:     General: Lids are normal.        Right eye: No discharge.        Left eye: No discharge.     Conjunctiva/sclera: Conjunctivae normal.     Pupils: Pupils are equal, round, and reactive to light.  Cardiovascular:     Rate and Rhythm: Normal rate and regular rhythm.     Heart sounds: Normal heart sounds.  Pulmonary:     Effort: Pulmonary effort is normal.     Breath sounds: Normal breath sounds.  Abdominal:     Palpations: Abdomen is soft.     Tenderness: There is no abdominal tenderness.  Musculoskeletal:        General: No swelling or deformity. Normal range of motion.     Cervical back: Normal range of motion and neck supple. No tenderness or bony tenderness.     Right lower leg: No edema.      Left lower leg: No edema.  Skin:    General: Skin is warm and dry.  Neurological:     Mental Status: He is alert and oriented to person, place, and time.     GCS: GCS eye subscore is 4. GCS verbal subscore is 5. GCS motor subscore is 6.     Cranial Nerves: No cranial nerve deficit.     Sensory: No sensory deficit.     Motor: No abnormal muscle tone.     Coordination: Coordination normal.     Gait: Gait normal.     Deep Tendon Reflexes: Reflexes are normal and symmetric.     Comments: No LUE weakness at time of my exam.     ED Results / Procedures / Treatments   Labs (all labs ordered are listed, but only abnormal results are displayed) Labs Reviewed  BASIC METABOLIC PANEL - Abnormal; Notable for the following components:      Result Value   Anion gap 3 (*)    All other components within normal limits  CBC WITH DIFFERENTIAL/PLATELET - Abnormal; Notable for the following components:   WBC 3.3 (*)    RBC 4.06 (*)    Neutro Abs 1.4 (*)    All other components within normal limits  PROTIME-INR  TROPONIN I (HIGH SENSITIVITY)  TROPONIN I (HIGH SENSITIVITY)    EKG EKG Interpretation  Date/Time:  Thursday September 23 2020 16:53:48 EDT Ventricular Rate:  63 PR Interval:  136 QRS Duration: 86 QT Interval:  394 QTC Calculation: 403 R Axis:   39 Text Interpretation: Normal sinus rhythm Minimal voltage criteria for LVH, may be normal variant ( Sokolow-Lyon ) Borderline ECG Confirmed by Ripley Fraise [phone]) on 09/24/2020 6:34:52 AM  Radiology DG Chest 2 View  Result Date: 09/23/2020 CLINICAL DATA:  Left arm numbness, chest pain EXAM: CHEST - 2 VIEW COMPARISON:  03/26/2020 FINDINGS: The heart size and mediastinal contours are within normal limits. Both lungs are clear. The visualized skeletal structures are unremarkable. IMPRESSION: No active cardiopulmonary disease. Electronically Signed   By: Randa Ngo M.D.   On: 09/23/2020 18:37   CT Head Wo Contrast  Result Date:  09/23/2020 CLINICAL DATA:  Transient ischemic attack (TIA) Left upper arm weakness. L sided weakness, onset today, 09/23/20 around noon. EXAM: CT HEAD WITHOUT CONTRAST TECHNIQUE: Contiguous axial images were obtained from the base of the skull through the vertex without intravenous contrast. COMPARISON:  None. FINDINGS: Brain: No  evidence of large-territorial acute infarction. No parenchymal hemorrhage. No mass lesion. No extra-axial collection. No mass effect or midline shift. No hydrocephalus. Basilar cisterns are patent. Vascular: No hyperdense vessel. Skull: No acute fracture or focal lesion. Sinuses/Orbits: Paranasal sinuses and mastoid air cells are clear. The orbits are unremarkable. Other: None. IMPRESSION: No acute intracranial abnormality. Electronically Signed   By: Iven Finn M.D.   On: 09/23/2020 19:58    Procedures Procedures   Medications Ordered in ED Medications - No data to display  ED Course  I have reviewed the triage vital signs and the nursing notes.  Pertinent labs & imaging results that were available during my care of the patient were reviewed by me and considered in my medical decision making (see chart for details).  Patient seen and examined. Work-up reviewed. Prolonged ED wait time -- symptoms are improving. Feels L arm symptoms are nearly resolved. My exam here is non-acute.   We discussed that main concern at this time is whether or not he had acute CVA. If MRI is positive, he would need admission. If negative, would need to watch symptoms very carefully and f/u if anything worsened. We discussed possibility of TIA, nerve impingement in c-spine/shoulder in this case.   Vital signs reviewed and are as follows: BP (!) 166/121   Pulse (!) 54   Temp 97.9 F (36.6 C) (Oral)   Resp 16   Ht '5\' 11"'$  (1.803 m)   Wt 81.2 kg   SpO2 100%   BMI 24.97 kg/m   11:08 AM MRI performed and is negative for acute stroke.  Patient was updated and seems reassured.  We  discussed the possibility of TIA or also peripheral neuropathy causing symptoms.  Currently his symptoms have completely resolved.  He is motivated to go because today is his son's birthday.  He like to follow-up with his primary care doctor.  I think this is reasonable.  We discussed signs symptoms at length which should cause him to return to the hospital.  Patient counseled to return if they have weakness in their arms or legs, slurred speech, trouble walking or talking, confusion, trouble with their balance, or if they have any other concerns. Patient verbalizes understanding and agrees with plan.     MDM Rules/Calculators/A&P                           Patient presents to the emergency department with left arm paresthesias and questionable weakness.  He has been evaluated with CT imaging and MRI which does not demonstrate acute stroke.  His symptoms have resolved.  TIA remains a possibility as well as peripheral process such as impingement in the neck or shoulder.  As patient symptoms are resolved and work-up is reassuring, plan for discharged home with strict return instructions and close follow-up.  Patient seems reliable to return if symptoms worsen.   Final Clinical Impression(s) / ED Diagnoses Final diagnoses:  Paresthesia of left arm    Rx / DC Orders ED Discharge Orders     None        Carlisle Cater, Hershal Coria 09/24/20 [place], MD 09/26/20 1105

## 2020-09-24 NOTE — Discharge Instructions (Addendum)
Please read and follow all provided instructions.  Your diagnoses today include:  1. Paresthesia of left arm     Tests performed today include: Blood cell counts (white, red, and platelets) Electrolytes  Kidney function test Cardiac enzymes - no sign of stress on the heart CT and MRI brain - neither showed any evidence of stroke Vital signs. See below for your results today.   Medications prescribed:  None  Take any prescribed medications only as directed.  Home care instructions:  Follow any educational materials contained in this packet.  BE VERY CAREFUL not to take multiple medicines containing Tylenol (also called acetaminophen). Doing so can lead to an overdose which can damage your liver and cause liver failure and possibly death.   Follow-up instructions: Please follow-up with your primary care provider in the next 3 days for further evaluation of your symptoms.   Return instructions:  Please return to the Emergency Department if you experience worsening symptoms.  Return immediately if you have weakness in your arms or legs, slurred speech, trouble walking or talking, confusion, or trouble with your balance.  Please return if you have any other emergent concerns.  Additional Information:  Your vital signs today were: BP (!) 144/110   Pulse 66   Temp 97.9 F (36.6 C) (Oral)   Resp (!) 21   Ht '5\' 11"'$  (1.803 m)   Wt 81.2 kg   SpO2 100%   BMI 24.97 kg/m  If your blood pressure (BP) was elevated above 135/85 this visit, please have this repeated by your doctor within one month. --------------

## 2021-04-28 ENCOUNTER — Ambulatory Visit (INDEPENDENT_AMBULATORY_CARE_PROVIDER_SITE_OTHER): Payer: PRIVATE HEALTH INSURANCE | Admitting: Primary Care

## 2022-01-09 ENCOUNTER — Encounter (HOSPITAL_COMMUNITY): Payer: Self-pay | Admitting: Emergency Medicine

## 2022-01-09 ENCOUNTER — Ambulatory Visit (HOSPITAL_COMMUNITY)
Admission: EM | Admit: 2022-01-09 | Discharge: 2022-01-09 | Disposition: A | Discharge status: Home / Self Care | Payer: Commercial Managed Care - HMO | Attending: Internal Medicine | Admitting: Internal Medicine

## 2022-01-09 DIAGNOSIS — K047 Periapical abscess without sinus: Secondary | ICD-10-CM

## 2022-01-09 MED ORDER — IBUPROFEN 600 MG PO TABS: 600.0000 mg | ORAL_TABLET | Freq: Four times a day (QID) | ORAL | 0 refills | Status: DC | PRN

## 2022-01-09 MED ORDER — AMOXICILLIN-POT CLAVULANATE 875-125 MG PO TABS: 1.0000 | ORAL_TABLET | Freq: Two times a day (BID) | ORAL | 0 refills | Status: DC

## 2022-01-09 MED ORDER — CHLORHEXIDINE GLUCONATE 0.12 % MT SOLN: 15.0000 mL | Freq: Two times a day (BID) | OROMUCOSAL | 0 refills | Status: DC

## 2022-01-09 NOTE — ED Triage Notes (Signed)
Pt reports a dental abscess on the left side. States it started bothering him on Friday.

## 2022-01-09 NOTE — Discharge Instructions (Addendum)
Please take medications as prescribed Please follow-up with a dentist for dental evaluation Return to urgent care if you have any other concerns.

## 2022-01-10 NOTE — ED Provider Notes (Addendum)
[facility]    CSN: [id number] Arrival date & time: 01/09/22  0813      History   Chief Complaint Chief Complaint  Patient presents with   Dental Problem    HPI Nicholas Richmond is a 61 y.o. male with a history of dental cavities comes to urgent care with 3-day history of left-sided tooth ache.  Pain is throbbing and aggravated by chewing.  Over-the-counter medication is helping with pain.  Patient describes pain as 4 out of 10 patient had associated gum swelling.  No fever or chills.  No nausea or vomiting.  No trauma to the jaw.  Patient denies any headaches, chest pain, shortness of breath, abdominal pain, nausea or vomiting.  Her blood pressure is elevated likely as a result of pain.  HPI  Past Medical History:  Diagnosis Date   Asthma    Hypertension     There are no problems to display for this patient.   History reviewed. No pertinent surgical history.     Home Medications    Prior to Admission medications   Medication Sig Start Date End Date Taking? Authorizing Provider  amoxicillin-clavulanate (AUGMENTIN) 875-125 MG tablet Take 1 tablet by mouth every 12 (twelve) hours. 01/09/22  Yes Charda Janis, Myrene Galas, MD  chlorhexidine (PERIDEX) 0.12 % solution Use as directed 15 mLs in the mouth or throat 2 (two) times daily. 01/09/22  Yes Amahd Morino, Myrene Galas, MD  ibuprofen (ADVIL) 600 MG tablet Take 1 tablet (600 mg total) by mouth every 6 (six) hours as needed. 01/09/22  Yes Jaramiah Bossard, Myrene Galas, MD  lisinopril-hydrochlorothiazide (PRINZIDE,ZESTORETIC) 20-25 MG tablet Take 1 tablet by mouth daily.    [provider]  ondansetron (ZOFRAN ODT) 4 MG disintegrating tablet '4mg'$  ODT q4 hours prn nausea/vomit 10/03/19   Muthersbaugh, Jarrett Soho, PA-C    Family History Family History  Problem Relation Age of Onset   Colon cancer Neg Hx    Esophageal cancer Neg Hx    Rectal cancer Neg Hx    Stomach cancer Neg Hx     Social History Social History   Tobacco Use    Smoking status: Never   Smokeless tobacco: Never  Substance Use Topics   Alcohol use: Yes    Alcohol/week: 1.0 standard drink of alcohol    Types: 1 Cans of beer per week    Comment: occasional   Drug use: Yes    Types: Cocaine, Marijuana     Allergies   Patient has no known allergies.   Review of Systems Review of Systems  HENT:  Positive for dental problem. Negative for congestion, sinus pressure, sinus pain, sneezing, sore throat and trouble swallowing.   Respiratory: Negative.    Cardiovascular: Negative.   Gastrointestinal: Negative.      Physical Exam Triage Vital Signs ED Triage Vitals [01/09/22 0853]  Enc Vitals Group     BP (!) 169/114     Pulse Rate 69     Resp 18     Temp 98 F (36.7 C)     Temp Source Oral     SpO2 97 %     Weight      Height      Head Circumference      Peak Flow      Pain Score 3     Pain Loc      Pain Edu?      Excl. in [place]?    No data found.  Updated Vital Signs BP (!) 169/114 (BP  Location: Left Arm)   Pulse 69   Temp 98 F (36.7 C) (Oral)   Resp 18   SpO2 97%   Visual Acuity Right Eye Distance:   Left Eye Distance:   Bilateral Distance:    Right Eye Near:   Left Eye Near:    Bilateral Near:     Physical Exam Vitals and nursing note reviewed.  Constitutional:      General: He is not in acute distress.    Appearance: Normal appearance. He is not ill-appearing.  HENT:     Nose:     Comments: Multiple teeth are missing.  Second left upper premolar with dental cavities.  Mild erythema of the gum around the tooth.    Mouth/Throat:     Mouth: Mucous membranes are moist.  Eyes:     Extraocular Movements: Extraocular movements intact.     Pupils: Pupils are equal, round, and reactive to light.  Cardiovascular:     Rate and Rhythm: Normal rate and regular rhythm.  Neurological:     Mental Status: He is alert.      UC Treatments / Results  Labs (all labs ordered are listed, but only abnormal results are  displayed) Labs Reviewed - No data to display  EKG   Radiology No results found.  Procedures Procedures (including critical care time)  Medications Ordered in UC Medications - No data to display  Initial Impression / Assessment and Plan / UC Course  I have reviewed the triage vital signs and the nursing notes.  Pertinent labs & imaging results that were available during my care of the patient were reviewed by me and considered in my medical decision making (see chart for details).     1.  Dental infection: Augmentin 875-125 mg twice daily for 7 days Ibuprofen 600 mg every 6 hours as needed for pain Chlorhexidine mouth rinse Maintain adequate hydration If symptoms worsen please return to urgent care to be reevaluated. Patient is advised to follow-up with a dentist for further management Final Clinical Impressions(s) / UC Diagnoses   Final diagnoses:  Dental infection     Discharge Instructions      Please take medications as prescribed Please follow-up with a dentist for dental evaluation Return to urgent care if you have any other concerns.   ED Prescriptions     Medication Sig Dispense Auth. Provider   amoxicillin-clavulanate (AUGMENTIN) 875-125 MG tablet Take 1 tablet by mouth every 12 (twelve) hours. 14 tablet Karenann Mcgrory, Myrene Galas, MD   ibuprofen (ADVIL) 600 MG tablet Take 1 tablet (600 mg total) by mouth every 6 (six) hours as needed. 30 tablet Iridessa Harrow, Myrene Galas, MD   chlorhexidine (PERIDEX) 0.12 % solution Use as directed 15 mLs in the mouth or throat 2 (two) times daily. 120 mL Calina Patrie, Myrene Galas, MD      PDMP not reviewed this encounter.   Chase Picket, MD 01/10/22 1604    Chase Picket, MD 01/10/22 1606

## 2022-05-10 ENCOUNTER — Ambulatory Visit (HOSPITAL_COMMUNITY)
Admission: EM | Admit: 2022-05-10 | Discharge: 2022-05-10 | Disposition: A | Discharge status: Home / Self Care | Payer: Commercial Managed Care - HMO | Attending: Emergency Medicine | Admitting: Emergency Medicine

## 2022-05-10 ENCOUNTER — Encounter (HOSPITAL_COMMUNITY): Payer: Self-pay

## 2022-05-10 ENCOUNTER — Other Ambulatory Visit: Payer: Self-pay

## 2022-05-10 DIAGNOSIS — K1120 Sialoadenitis, unspecified: Secondary | ICD-10-CM | POA: Diagnosis not present

## 2022-05-10 MED ORDER — METHYLPREDNISOLONE SODIUM SUCC 125 MG IJ SOLR
INTRAMUSCULAR | Status: AC
  Filled 2022-05-10: qty 2

## 2022-05-10 MED ORDER — AMOXICILLIN-POT CLAVULANATE 875-125 MG PO TABS: 1.0000 | ORAL_TABLET | Freq: Two times a day (BID) | ORAL | 0 refills | Status: DC

## 2022-05-10 MED ORDER — METHYLPREDNISOLONE SODIUM SUCC 125 MG IJ SOLR
60.0000 mg | Freq: Once | INTRAMUSCULAR | Status: AC
  Administered 2022-05-10: 60 mg via INTRAMUSCULAR

## 2022-05-10 NOTE — ED Provider Notes (Signed)
[facility]    CSN: [id number] Arrival date & time: 05/10/22  1501      History   Chief Complaint Chief Complaint  Patient presents with   Neck Pain    HPI Nicholas Richmond is a 61 y.o. male.   Patient presents to clinic with right-sided mandibular / mastoid pain and swelling for the past week.  Over the past week he has been taking Tylenol, which has been working for his pain,, however swelling and pain persist.  Denies recent illness. Does report a dry cough. He denies fever, trouble swallowing, or dental abscess.  Reports current pain 3 out of 10.   The history is provided by the patient.  Neck Pain Associated symptoms: no chest pain and no fever     Past Medical History:  Diagnosis Date   Asthma    Hypertension     There are no problems to display for this patient.   History reviewed. No pertinent surgical history.     Home Medications    Prior to Admission medications   Medication Sig Start Date End Date Taking? Authorizing Provider  amoxicillin-clavulanate (AUGMENTIN) 875-125 MG tablet Take 1 tablet by mouth every 12 (twelve) hours. 05/10/22  Yes Louretta Shorten, [place] N, FNP  ibuprofen (ADVIL) 600 MG tablet Take 1 tablet (600 mg total) by mouth every 6 (six) hours as needed. 01/09/22   LampteyMyrene Galas, MD  lisinopril-hydrochlorothiazide (PRINZIDE,ZESTORETIC) 20-25 MG tablet Take 1 tablet by mouth daily.    [provider]  ondansetron (ZOFRAN ODT) 4 MG disintegrating tablet '4mg'$  ODT q4 hours prn nausea/vomit 10/03/19   Muthersbaugh, Jarrett Soho, PA-C    Family History Family History  Problem Relation Age of Onset   Colon cancer Neg Hx    Esophageal cancer Neg Hx    Rectal cancer Neg Hx    Stomach cancer Neg Hx     Social History Social History   Tobacco Use   Smoking status: Never   Smokeless tobacco: Never  Vaping Use   Vaping Use: Never used  Substance Use Topics   Alcohol use: Yes    Alcohol/week: 1.0 standard drink of alcohol     Types: 1 Cans of beer per week    Comment: occasional   Drug use: Yes    Types: Marijuana     Allergies   Patient has no known allergies.   Review of Systems Review of Systems  Constitutional:  Negative for chills and fever.  HENT:  Positive for facial swelling. Negative for ear pain and sore throat.   Eyes:  Negative for pain and visual disturbance.  Respiratory:  Positive for cough. Negative for shortness of breath.   Cardiovascular:  Negative for chest pain and palpitations.  Gastrointestinal:  Negative for abdominal pain and vomiting.  Genitourinary:  Negative for dysuria and hematuria.  Musculoskeletal:  Positive for neck pain. Negative for arthralgias and back pain.  Skin:  Negative for color change and rash.  Neurological:  Negative for seizures and syncope.  All other systems reviewed and are negative.    Physical Exam Triage Vital Signs ED Triage Vitals [05/10/22 1615]  Enc Vitals Group     BP      Pulse      Resp      Temp      Temp src      SpO2      Weight      Height      Head Circumference  Peak Flow      Pain Score 3     Pain Loc      Pain Edu?      Excl. in [place]?    No data found.  Updated Vital Signs BP (!) 153/91 (BP Location: Left Arm)   Pulse 76   Temp 98.6 F (37 C) (Oral)   Resp 16   SpO2 97%   Visual Acuity Right Eye Distance:   Left Eye Distance:   Bilateral Distance:    Right Eye Near:   Left Eye Near:    Bilateral Near:     Physical Exam Vitals and nursing note reviewed.  Constitutional:      General: He is not in acute distress.    Appearance: He is well-developed.  HENT:     Head: Normocephalic and atraumatic. No contusion, right periorbital erythema, left periorbital erythema or laceration.     Salivary Glands: Right salivary gland is diffusely enlarged and tender.     Right Ear: Tympanic membrane, ear canal and external ear normal. There is no impacted cerumen.     Left Ear: Tympanic membrane, ear canal and  external ear normal. There is no impacted cerumen.     Nose: Nose normal.     Mouth/Throat:     Mouth: Mucous membranes are moist.     Pharynx: Oropharynx is clear. Uvula midline. No pharyngeal swelling, oropharyngeal exudate, posterior oropharyngeal erythema or uvula swelling.     Tonsils: No tonsillar exudate or tonsillar abscesses.  Eyes:     General: Lids are normal.     Conjunctiva/sclera: Conjunctivae normal.     Pupils: Pupils are equal, round, and reactive to light.  Cardiovascular:     Rate and Rhythm: Normal rate and regular rhythm.     Heart sounds: Normal heart sounds, S1 normal and S2 normal. No murmur heard. Pulmonary:     Effort: Pulmonary effort is normal. No respiratory distress.     Breath sounds: Normal breath sounds.     Comments: Lungs vesicular posteriorly. Musculoskeletal:        General: No swelling. Normal range of motion.     Cervical back: Normal range of motion and neck supple. No erythema, rigidity or crepitus. No pain with movement.  Lymphadenopathy:     Head:     Right side of head: Submandibular and posterior auricular adenopathy present.     Left side of head: No submandibular adenopathy.     Cervical: Cervical adenopathy present.  Skin:    General: Skin is warm and dry.     Capillary Refill: Capillary refill takes less than 2 seconds.  Neurological:     Mental Status: He is alert and oriented to person, place, and time.  Psychiatric:        Mood and Affect: Mood normal.        Behavior: Behavior is cooperative.      UC Treatments / Results  Labs (all labs ordered are listed, but only abnormal results are displayed) Labs Reviewed - No data to display  EKG   Radiology No results found.  Procedures Procedures (including critical care time)  Medications Ordered in UC Medications  methylPREDNISolone sodium succinate (SOLU-MEDROL) 125 mg/2 mL injection 60 mg (has no administration in time range)    Initial Impression / Assessment and  Plan / UC Course  I have reviewed the triage vital signs and the nursing notes.  Pertinent labs & imaging results that were available during my care of the patient  were reviewed by me and considered in my medical decision making (see chart for details).  Vital signs triage reviewed, patient is hemodynamically stable.  Afebrile.  Presents to clinic with a week of parotid gland swelling and tenderness.  Discussed viral versus bacterial etiology, will cover for bacterial due to tenderness, discomfort and duration.  Discussed massages, eating sour candies and other symptomatic management.  Encouraged to follow-up with primary care provider as scheduled.  Patient verbalized understanding, no questions at this time.  Return emergency precautions discussed.     Final Clinical Impressions(s) / UC Diagnoses   Final diagnoses:  Parotiditis     Discharge Instructions      To have swelling of your parotid gland.  It is unclear if this is bacterial versus viral, however, to cover you for bacterial due to your pain and discomfort.  Please take all antibiotics as prescribed.  You can alternate between Tylenol and ibuprofen for pain, discomfort.  You can do a warm compress and do a gentle massage of the area.  You can also do lozenges and sour candies to help increase your salivary production.  Please follow-up with your primary care as scheduled.  Please return to clinic or an emergency department if no improvement after antibiotics, you develop shortness of breath, inability or trouble swallowing, or worsening of symptoms.      ED Prescriptions     Medication Sig Dispense Auth. Provider   amoxicillin-clavulanate (AUGMENTIN) 875-125 MG tablet Take 1 tablet by mouth every 12 (twelve) hours. 14 tablet Charina Fons, [place] N, [place]      I have reviewed the PDMP during this encounter.   Siriah Treat, [place] N, [place] 05/10/22 [phone]

## 2022-05-10 NOTE — Discharge Instructions (Addendum)
To have swelling of your parotid gland.  It is unclear if this is bacterial versus viral, however, to cover you for bacterial due to your pain and discomfort.  Please take all antibiotics as prescribed.  You can alternate between Tylenol and ibuprofen for pain, discomfort.  You can do a warm compress and do a gentle massage of the area.  You can also do lozenges and sour candies to help increase your salivary production.  Please follow-up with your primary care as scheduled.  Please return to clinic or an emergency department if no improvement after antibiotics, you develop shortness of breath, inability or trouble swallowing, or worsening of symptoms.

## 2022-05-10 NOTE — ED Triage Notes (Signed)
Patient reports that he has been having right neck swelling and pain x 1 week.   Patient states he has been taking Tylenol with very little relief. Patient states he took 1000 mg at 1330 today.

## 2022-07-25 ENCOUNTER — Ambulatory Visit (HOSPITAL_COMMUNITY)
Admission: EM | Admit: 2022-07-25 | Discharge: 2022-07-25 | Disposition: A | Discharge status: Home / Self Care | Payer: Managed Care, Other (non HMO)

## 2022-07-25 ENCOUNTER — Encounter (HOSPITAL_COMMUNITY): Payer: Self-pay | Admitting: *Deleted

## 2022-07-25 DIAGNOSIS — K623 Rectal prolapse: Secondary | ICD-10-CM | POA: Diagnosis not present

## 2022-07-25 DIAGNOSIS — I10 Essential (primary) hypertension: Secondary | ICD-10-CM | POA: Diagnosis not present

## 2022-07-25 MED ORDER — AMLODIPINE BESYLATE 10 MG PO TABS: 10.0000 mg | ORAL_TABLET | Freq: Every day | ORAL | 0 refills | Status: DC

## 2022-07-25 NOTE — ED Provider Notes (Signed)
[facility]    CSN: [id number] Arrival date & time: 07/25/22  0802      History   Chief Complaint Chief Complaint  Patient presents with   Hemorrhoids    HPI Nicholas Richmond is a 62 y.o. male.   Pleasant 62 year old male presents today due to concerns of a possible hemorrhoid.  He states he has had hemorrhoids in the past and feels like this is similar.  Last night he reports a discomfort to his anal region.  Occurred spontaneously.  He denies any excessive straining or heavy lifting.  He does note over the past month that his stomach has felt "odd", but denies pain.  He also denies nausea or vomiting.  He saw blood in his stool last evening but none in his underwear.  Only scant amount in his stool this morning, significantly improved since yesterday.  He denies fever or chills.     Past Medical History:  Diagnosis Date   Asthma    Hypertension     There are no problems to display for this patient.   History reviewed. No pertinent surgical history.     Home Medications    Prior to Admission medications   Medication Sig Start Date End Date Taking? Authorizing Provider  amLODipine (NORVASC) 10 MG tablet Take 1 tablet (10 mg total) by mouth daily. 07/25/22 08/24/22  Maretta Bees, PA    Family History Family History  Problem Relation Age of Onset   Colon cancer Neg Hx    Esophageal cancer Neg Hx    Rectal cancer Neg Hx    Stomach cancer Neg Hx     Social History Social History   Tobacco Use   Smoking status: Never   Smokeless tobacco: Never  Vaping Use   Vaping Use: Never used  Substance Use Topics   Alcohol use: Yes    Alcohol/week: 1.0 standard drink of alcohol    Types: 1 Cans of beer per week    Comment: occasional   Drug use: Yes    Types: Marijuana     Allergies   Patient has no known allergies.   Review of Systems Review of Systems As per HPI  Physical Exam Triage Vital Signs ED Triage Vitals  Enc Vitals Group     BP  07/25/22 0823 (!) 153/113     Pulse Rate 07/25/22 0823 85     Resp 07/25/22 0823 18     Temp 07/25/22 0823 98 F (36.7 C)     Temp Source 07/25/22 0823 Oral     SpO2 07/25/22 0823 97 %     Weight --      Height --      Head Circumference --      Peak Flow --      Pain Score 07/25/22 0822 5     Pain Loc --      Pain Edu? --      Excl. in [place]? --    No data found.  Updated Vital Signs BP (!) 153/113 (BP Location: Right Arm)   Pulse 85   Temp 98 F (36.7 C) (Oral)   Resp 18   SpO2 97%   Visual Acuity Right Eye Distance:   Left Eye Distance:   Bilateral Distance:    Right Eye Near:   Left Eye Near:    Bilateral Near:     Physical Exam Vitals and nursing note reviewed.  Constitutional:      General: He is not in  acute distress.    Appearance: Normal appearance. He is normal weight. He is not ill-appearing, toxic-appearing or diaphoretic.  HENT:     Head: Normocephalic and atraumatic.  Cardiovascular:     Rate and Rhythm: Normal rate and regular rhythm.  Pulmonary:     Effort: Pulmonary effort is normal. No respiratory distress.     Breath sounds: Normal breath sounds. No stridor.  Abdominal:     General: Abdomen is flat. Bowel sounds are normal. There is no distension.     Palpations: Abdomen is soft. There is no mass.     Tenderness: There is no abdominal tenderness. There is no right CVA tenderness, left CVA tenderness, guarding or rebound.     Hernia: No hernia is present.  Genitourinary:    Rectum: Tenderness present. No anal fissure, external hemorrhoid or internal hemorrhoid.       Comments: Rectal prolapse Musculoskeletal:     Cervical back: Normal range of motion and neck supple. No rigidity or tenderness.  Lymphadenopathy:     Cervical: No cervical adenopathy.  Neurological:     Mental Status: He is alert.      UC Treatments / Results  Labs (all labs ordered are listed, but only abnormal results are displayed) Labs Reviewed - No data to  display  EKG   Radiology No results found.  Procedures Procedures (including critical care time)  Medications Ordered in UC Medications - No data to display  Initial Impression / Assessment and Plan / UC Course  I have reviewed the triage vital signs and the nursing notes.  Pertinent labs & imaging results that were available during my care of the patient were reviewed by me and considered in my medical decision making (see chart for details).     Rectal prolapse - there is no evidence of a hemorrhoid. There is a rectal prolapse without evidence of bleeding. I was unable to reduce this. Will refer to GI (saw [facility] in 2020). Out of work x 3 days to avoid heavy lifting or straining. Essential HTN - will refill pt's BP medication. Start taking daily.  We made patient a new patient primary care appointment for follow-up and management.   Final Clinical Impressions(s) / UC Diagnoses   Final diagnoses:  Rectal prolapse  Essential hypertension     Discharge Instructions      You do not have a hemorrhoid, but a rectal prolapse.  Please call [facility] today to schedule an appointment at your earliest convenience. If you have uncontrolled rectal bleeding or inability to poop, please head to the emergency room.  We have set you up with a new primary care physician.  Please schedule an annual physical and a blood pressure follow-up with them. I have refilled 1 month of your Norvasc.  Please take this daily and monitor your blood pressure at home, goal blood pressure 120/80.     ED Prescriptions     Medication Sig Dispense Auth. Provider   amLODipine (NORVASC) 10 MG tablet Take 1 tablet (10 mg total) by mouth daily. 30 tablet Dezyrae Kensinger L, [place]      PDMP not reviewed this encounter.   Maretta Bees, [place] 07/25/22 [phone]

## 2022-07-25 NOTE — ED Triage Notes (Signed)
Pt states he has had rectal pain since yesterday. He did have some BRB yesterday but none today. He isnt using any meds.

## 2022-07-25 NOTE — Discharge Instructions (Signed)
You do not have a hemorrhoid, but a rectal prolapse.  Please call [facility] today to schedule an appointment at your earliest convenience. If you have uncontrolled rectal bleeding or inability to poop, please head to the emergency room.  We have set you up with a new primary care physician.  Please schedule an annual physical and a blood pressure follow-up with them. I have refilled 1 month of your Norvasc.  Please take this daily and monitor your blood pressure at home, goal blood pressure 120/80.

## 2022-08-07 ENCOUNTER — Ambulatory Visit: Payer: Managed Care, Other (non HMO) | Admitting: Physician Assistant

## 2022-08-24 ENCOUNTER — Telehealth: Payer: Self-pay | Admitting: Physician Assistant

## 2022-08-24 ENCOUNTER — Encounter: Payer: Self-pay | Admitting: Physician Assistant

## 2022-08-24 ENCOUNTER — Ambulatory Visit: Payer: Managed Care, Other (non HMO) | Admitting: Physician Assistant

## 2022-08-24 ENCOUNTER — Other Ambulatory Visit: Payer: Self-pay

## 2022-08-24 ENCOUNTER — Encounter: Payer: Self-pay | Admitting: Gastroenterology

## 2022-08-24 VITALS — BP 152/96 | HR 75 | Temp 97.5°F | Ht 71.0 in | Wt 168.4 lb

## 2022-08-24 DIAGNOSIS — I10 Essential (primary) hypertension: Secondary | ICD-10-CM

## 2022-08-24 DIAGNOSIS — Z23 Encounter for immunization: Secondary | ICD-10-CM

## 2022-08-24 DIAGNOSIS — Z125 Encounter for screening for malignant neoplasm of prostate: Secondary | ICD-10-CM

## 2022-08-24 DIAGNOSIS — K623 Rectal prolapse: Secondary | ICD-10-CM

## 2022-08-24 DIAGNOSIS — Z13220 Encounter for screening for lipoid disorders: Secondary | ICD-10-CM | POA: Diagnosis not present

## 2022-08-24 DIAGNOSIS — Z1159 Encounter for screening for other viral diseases: Secondary | ICD-10-CM

## 2022-08-24 DIAGNOSIS — R194 Change in bowel habit: Secondary | ICD-10-CM | POA: Diagnosis not present

## 2022-08-24 DIAGNOSIS — Z131 Encounter for screening for diabetes mellitus: Secondary | ICD-10-CM

## 2022-08-24 LAB — COMPREHENSIVE METABOLIC PANEL
ALT: 18 U/L (ref 0–53)
AST: 18 U/L (ref 0–37)
Albumin: 3.7 g/dL (ref 3.5–5.2)
Alkaline Phosphatase: 69 U/L (ref 39–117)
BUN: 18 mg/dL (ref 6–23)
CO2: 25 mEq/L (ref 19–32)
Calcium: 9.8 mg/dL (ref 8.4–10.5)
Chloride: 110 mEq/L (ref 96–112)
Creatinine, Ser: 1.21 mg/dL (ref 0.40–1.50)
GFR: 65.43 mL/min (ref >60.00)
Glucose, Bld: 108 mg/dL — ABNORMAL HIGH (ref 70–99)
Potassium: 4.3 mEq/L (ref 3.5–5.1)
Sodium: 141 mEq/L (ref 135–145)
Total Bilirubin: 0.6 mg/dL (ref 0.2–1.2)
Total Protein: 6.4 g/dL (ref 6.0–8.3)

## 2022-08-24 LAB — CBC WITH DIFFERENTIAL/PLATELET
Basophils Absolute: 0 10*3/uL (ref 0.0–0.1)
Basophils Relative: 0.9 % (ref 0.0–3.0)
Eosinophils Absolute: 0 10*3/uL (ref 0.0–0.7)
Eosinophils Relative: 1.3 % (ref 0.0–5.0)
HCT: 42.4 % (ref 39.0–52.0)
Hemoglobin: 14.3 g/dL (ref 13.0–17.0)
Lymphocytes Relative: 28.9 % (ref 12.0–46.0)
Lymphs Abs: 1.1 10*3/uL (ref 0.7–4.0)
MCHC: 33.8 g/dL (ref 30.0–36.0)
MCV: 101.4 fl — ABNORMAL HIGH (ref 78.0–100.0)
Monocytes Absolute: 0.4 10*3/uL (ref 0.1–1.0)
Monocytes Relative: 10.4 % (ref 3.0–12.0)
Neutro Abs: 2.2 10*3/uL (ref 1.4–7.7)
Neutrophils Relative %: 58.5 % (ref 43.0–77.0)
Platelets: 292 10*3/uL (ref 150.0–400.0)
RBC: 4.18 Mil/uL — ABNORMAL LOW (ref 4.22–5.81)
RDW: 13.2 % (ref 11.5–15.5)
WBC: 3.8 10*3/uL — ABNORMAL LOW (ref 4.0–10.5)

## 2022-08-24 LAB — PSA: PSA: 1.86 ng/mL (ref 0.10–4.00)

## 2022-08-24 LAB — LIPID PANEL
Cholesterol: 117 mg/dL (ref 0–200)
HDL: 45.9 mg/dL (ref >39.00)
LDL Cholesterol: 45 mg/dL (ref 0–99)
NonHDL: 71.1
Total CHOL/HDL Ratio: 3
Triglycerides: 133 mg/dL (ref 0.0–149.0)
VLDL: 26.6 mg/dL (ref 0.0–40.0)

## 2022-08-24 LAB — HEMOGLOBIN A1C: Hgb A1c MFr Bld: 5.6 % (ref 4.6–6.5)

## 2022-08-24 LAB — TSH: TSH: 0.25 u[IU]/mL — ABNORMAL LOW (ref 0.35–5.50)

## 2022-08-24 MED ORDER — HYDROCHLOROTHIAZIDE 12.5 MG PO TABS: 12.5000 mg | ORAL_TABLET | Freq: Every day | ORAL | 1 refills | Status: DC

## 2022-08-24 MED ORDER — AMLODIPINE BESYLATE 10 MG PO TABS: 10.0000 mg | ORAL_TABLET | Freq: Every day | ORAL | 3 refills | Status: DC

## 2022-08-24 MED ORDER — HYDROCHLOROTHIAZIDE 12.5 MG PO TABS
12.5000 mg | ORAL_TABLET | Freq: Every day | ORAL | 1 refills | Status: DC
Start: 2022-08-24 — End: 2022-08-24

## 2022-08-24 NOTE — Patient Instructions (Addendum)
Welcome to [facility]! It was a pleasure meeting you today.  As discussed, Please schedule a 1 month follow up visit today.  First Shingrix shot today  Labs today   Referral to Dr. Lanetta Inch office: [phone]  For blood pressure, continue amlodipine 10 mg, add hydrochlorothiazide 12.5 mg; monitor readings at home.   PLEASE NOTE:  If you had any LAB tests please let us know if you have not heard back within a few days. You may see your results on MyChart before we have a chance to review them but we will give you a call once they are reviewed by [place]. If we ordered any REFERRALS today, please let us know if you have not heard from their office within the next two weeks. Let us know through MyChart if you are needing REFILLS, or have your pharmacy send [place] the request. You can also use MyChart to communicate with me or any office staff.  Please try these tips to maintain a healthy lifestyle:  Eat most of your calories during the day when you are active. Eliminate processed foods including packaged sweets (pies, cakes, cookies), reduce intake of potatoes, white bread, white pasta, and white rice. Look for whole grain options, oat flour or almond flour.  Each meal should contain half fruits/vegetables, one quarter protein, and one quarter carbs (no bigger than a computer mouse).  Cut down on sweet beverages. This includes juice, soda, and sweet tea. Also watch fruit intake, though this is a healthier sweet option, it still contains natural sugar! Limit to 3 servings daily.  Drink at least 1 glass of water with each meal and aim for at least 8 glasses (64 ounces) per day.  Exercise at least 150 minutes every week to the best of your ability.    Take Care,  Darris Carachure, PA-C

## 2022-08-24 NOTE — Telephone Encounter (Signed)
Please see note; awaiting triage note

## 2022-08-24 NOTE — Telephone Encounter (Signed)
Noted and agreed, thank you. 

## 2022-08-24 NOTE — Progress Notes (Signed)
Subjective:    Patient ID: Nicholas Richmond, male    DOB: 30-Jul-1960, 62 y.o.   MRN: [id number]  Chief Complaint  Patient presents with   New Patient (Initial Visit)    NP in office to establish care with PCP;  pt not seen a PCP since 2020. Pt due for CPE and fasting labs. Pt states when having bowel movement and feeling very bloated. Knows he has hemorrhoids but something is going on. Colonoscopy was good 3-4 years ago.      HPI 62 y.o. patient presents today for new patient establishment with me.  Patient has not had a PCP in a few years. He has been seeing the urgent care for needs. Two sons and two daughters, all healthy.   Current Care Team: None   Acute Concerns: Diagnosed with rectal prolapse 07/25/22 at urgent care. Has been having occasional pains in abdomen and changes in stool consistency.  Occasionally sees some blood in his stool. The other day passed some fecal incontinence at work. Sometimes more solid and other days more loose. Colonoscopy in 2020 - polyp, ext hemorrhoids, diverticulosis.   BP has been running high despite amlodipine.   Needs labs updated.    Past Medical History:  Diagnosis Date   Asthma    Hemorrhoids    Hypertension     History reviewed. No pertinent surgical history.  Family History  Problem Relation Age of Onset   Colon cancer Neg Hx    Esophageal cancer Neg Hx    Rectal cancer Neg Hx    Stomach cancer Neg Hx     Social History   Tobacco Use   Smoking status: Never   Smokeless tobacco: Never  Vaping Use   Vaping Use: Never used  Substance Use Topics   Alcohol use: Yes    Alcohol/week: 3.0 standard drinks of alcohol    Types: 3 Cans of beer per week    Comment: occasional   Drug use: Yes    Types: Marijuana     No Known Allergies  Review of Systems NEGATIVE UNLESS OTHERWISE INDICATED IN HPI      Objective:     BP (!) 152/96 (BP Location: Left Arm, Patient Position: Sitting)   Pulse 75   Temp (!) 97.5 F (36.4  C) (Temporal)   Ht 5\' 11"  (1.803 m)   Wt 168 lb 6.4 oz (76.4 kg)   SpO2 98%   BMI 23.49 kg/m   Wt Readings from Last 3 Encounters:  08/24/22 168 lb 6.4 oz (76.4 kg)  09/23/20 179 lb (81.2 kg)  10/02/19 175 lb (79.4 kg)    BP Readings from Last 3 Encounters:  08/24/22 (!) 152/96  07/25/22 (!) 153/113  05/10/22 (!) 153/91     Physical Exam Vitals and nursing note reviewed.  Constitutional:      General: He is not in acute distress.    Appearance: Normal appearance. He is not toxic-appearing.  HENT:     Head: Normocephalic and atraumatic.     Right Ear: External ear normal.     Left Ear: External ear normal.  Eyes:     Extraocular Movements: Extraocular movements intact.     Conjunctiva/sclera: Conjunctivae normal.     Pupils: Pupils are equal, round, and reactive to light.  Cardiovascular:     Rate and Rhythm: Normal rate and regular rhythm.     Pulses: Normal pulses.     Heart sounds: Normal heart sounds.  Pulmonary:     Effort:  Pulmonary effort is normal.     Breath sounds: Normal breath sounds.  Abdominal:     General: Abdomen is flat. Bowel sounds are normal.     Palpations: Abdomen is soft.     Tenderness: There is no abdominal tenderness.  Musculoskeletal:     Cervical back: Normal range of motion and neck supple.     Right lower leg: No edema.     Left lower leg: No edema.  Skin:    General: Skin is warm and dry.  Neurological:     General: No focal deficit present.     Mental Status: He is alert and oriented to person, place, and time.  Psychiatric:        Mood and Affect: Mood normal.        Behavior: Behavior normal.        Assessment & Plan:  Essential hypertension Assessment & Plan: Elevated Cont amlodipine 10 mg Add hydrochlorothiazide 12.5 mg Track at home Lifestyle habits to help with BP control   Orders: -     CBC with Differential/Platelet -     Comprehensive metabolic panel -     amLODIPine Besylate; Take 1 tablet (10 mg total)  by mouth daily.  Dispense: 90 tablet; Refill: 3 -     hydroCHLOROthiazide; Take 1 tablet (12.5 mg total) by mouth daily.  Dispense: 30 tablet; Refill: 1  Rectal prolapse Assessment & Plan: I personally reviewed note from urgent care. Pt continues to have bowel issues. Will send referral to Dr. Adela Lank who did his colonoscopy in 2020. If red flags, he will go to ED in the meantime.   Orders: -     CBC with Differential/Platelet -     Comprehensive metabolic panel -     Ambulatory referral to Gastroenterology  Change in bowel habits -     CBC with Differential/Platelet -     Comprehensive metabolic panel -     TSH -     Ambulatory referral to Gastroenterology  Immunization due -     Varicella-zoster vaccine IM  Need for hepatitis C screening test -     Hepatitis C antibody  Prostate cancer screening -     PSA  Diabetes mellitus screening -     Comprehensive metabolic panel -     Hemoglobin A1c  Screening for cholesterol level -     Lipid panel       Return in about 4 weeks (around 09/21/2022) for recheck/follow-up, blood pressure check.   Cosimo Schertzer M Gesselle Fitzsimons, PA-C

## 2022-08-24 NOTE — Assessment & Plan Note (Signed)
I personally reviewed note from urgent care. Pt continues to have bowel issues. Will send referral to Dr. Adela Lank who did his colonoscopy in 2020. If red flags, he will go to ED in the meantime.

## 2022-08-24 NOTE — Assessment & Plan Note (Signed)
Elevated Cont amlodipine 10 mg Add hydrochlorothiazide 12.5 mg Track at home Lifestyle habits to help with BP control

## 2022-08-24 NOTE — Telephone Encounter (Addendum)
Final outcome: See PCP within 3 days. Patient was scheduled for 6/24 @ 12:30pm. I informed pt to stay by phone in case Kela needs to call back. Pt verbalized understanding.   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 Translation No Nurse Assessment Nurse: Shawnie Dapper, RN, Arline Asp Date/Time (Eastern Time): 08/24/2022 2:25:50 PM Confirm and document reason for call. If symptomatic, describe symptoms. ---Caller states that he go his shingles vaccine and now is feeling shaky. Caller states that he noticed tremors when he woke up. Denies numbness or tingling. Does the patient have any new or worsening symptoms? ---Yes Will a triage be completed? ---Yes Related visit to physician within the last 2 weeks? ---Yes Does the PT have any chronic conditions? (i.e. diabetes, asthma, this includes High risk factors for pregnancy, etc.) ---No Is this a behavioral health or substance abuse call? ---No Guidelines Guideline Title Affirmed Question Affirmed Notes Nurse Date/Time (Eastern Time) Immunization Reactions Shingles (Herpes zoster; Shingrix) vaccine reactions Shawnie Dapper, RN, Arline Asp 08/24/2022 2:27:23 PM Muscle Jerks - Tics - Shudders Shuddering (trembling) occurs without an obvious cause Elana Alm 08/24/2022  2:30:57 PM PLEASE NOTE: All timestamps contained within this report are represented as [place] Standard Time. CONFIDENTIALTY NOTICE: This fax transmission is intended only for the addressee. It contains information that is legally privileged, confidential or otherwise protected from use or disclosure. If you are not the intended recipient, you are strictly prohibited from reviewing, disclosing, copying using or disseminating any of this information or taking any action in reliance on or regarding this information. If you have received this fax in error, please notify us immediately by telephone so that we can arrange for its return to [place]. Phone: [phone], Toll-Free: [phone], Fax: [phone] Page: 2 of 2 Call Id: [id number] Disp. Time Lamount Cohen Time) Disposition Final User 08/24/2022 2:14:17 PM Attempt made - message left Elana Alm 08/24/2022 2:30:34 PM Home Care Shawnie Dapper, RN, Arline Asp 08/24/2022 2:33:36 PM SEE PCP WITHIN 3 DAYS Yes Shawnie Dapper RN, Arline Asp Final Disposition 08/24/2022 2:33:36 PM SEE PCP WITHIN 3 DAYS Yes Shawnie Dapper, RN, Ave Filter Disagree/Comply Comply Caller Understands Yes PreDisposition Did not know what to do Care Advice Given Per Guideline CARE ADVICE given per Immunization Reactions (Adult) guideline. SEE PCP WITHIN 3 DAYS: * You need to be seen within 2 or 3 days. CALL BACK IF: * You become worse CARE ADVICE given per Muscle Jerks - Tics - Shudders (Adult) guideline. Comments User: Christy Gentles, RN Date/Time Lamount Cohen Time): 08/24/2022 2:34:44 PM Per [email or web address] " The most common side effects of SHINGRIX are: Pain, redness, and swelling at the injection site Muscle pain Tiredness Headache Shivering Fever Upset stomach Referrals REFERRED TO PCP OFFICE

## 2022-08-24 NOTE — Telephone Encounter (Signed)
Noted  

## 2022-08-24 NOTE — Telephone Encounter (Signed)
FYI: This call has been transferred to Access Nurse. Once the result note has been entered staff can address the message at that time.  Patient called in with the following symptoms:  Red Word: Possible adverse reaction to shingles vaccine   Please advise at Mobile [phone] (mobile)  Message is routed to Provider Pool and [facility] Triage

## 2022-08-25 LAB — HEPATITIS C ANTIBODY: Hepatitis C Ab: NONREACTIVE

## 2022-08-28 ENCOUNTER — Ambulatory Visit: Payer: Managed Care, Other (non HMO) | Admitting: Physician Assistant

## 2022-08-28 ENCOUNTER — Encounter: Payer: Self-pay | Admitting: Physician Assistant

## 2022-08-28 VITALS — BP 175/108 | HR 71 | Temp 97.5°F | Ht 71.0 in | Wt 171.8 lb

## 2022-08-28 DIAGNOSIS — T50Z95A Adverse effect of other vaccines and biological substances, initial encounter: Secondary | ICD-10-CM

## 2022-08-28 DIAGNOSIS — R7989 Other specified abnormal findings of blood chemistry: Secondary | ICD-10-CM

## 2022-08-28 DIAGNOSIS — R718 Other abnormality of red blood cells: Secondary | ICD-10-CM | POA: Diagnosis not present

## 2022-08-28 DIAGNOSIS — I10 Essential (primary) hypertension: Secondary | ICD-10-CM

## 2022-08-28 NOTE — Progress Notes (Unsigned)
Subjective:    Patient ID: Nicholas Richmond, male    DOB: 10/16/1960, 62 y.o.   MRN: [id number]  Chief Complaint  Patient presents with   Acute Visit    Reaction to vaccine  from shingles ,  hands were shaking on Friday  , has had  any thing [place] since  last week. Pt said that the feels better today     HPI Patient is in today for follow-up visit.  He had first shingles vaccine on Thursday 08/24/22, went home and went to bed. Woke up feeling sweaty and shaky, had to call out of work on Friday. No rash. No chest pain or SOB. Slept a lot this weekend. Feeling better today. States he'll still be open to getting the second shingles vaccine.  Also here to go over labs and recheck BP.   Past Medical History:  Diagnosis Date   Asthma    Hemorrhoids    Hypertension     History reviewed. No pertinent surgical history.  Family History  Problem Relation Age of Onset   Colon cancer Neg Hx    Esophageal cancer Neg Hx    Rectal cancer Neg Hx    Stomach cancer Neg Hx     Social History   Tobacco Use   Smoking status: Never   Smokeless tobacco: Never  Vaping Use   Vaping Use: Never used  Substance Use Topics   Alcohol use: Yes    Alcohol/week: 3.0 standard drinks of alcohol    Types: 3 Cans of beer per week    Comment: occasional   Drug use: Yes    Types: Marijuana     No Known Allergies  Review of Systems NEGATIVE UNLESS OTHERWISE INDICATED IN HPI      Objective:     BP (!) 175/108   Pulse 71   Temp (!) 97.5 F (36.4 C)   Ht 5\' 11"  (1.803 m)   Wt 171 lb 12.8 oz (77.9 kg)   SpO2 97%   BMI 23.96 kg/m   Wt Readings from Last 3 Encounters:  08/28/22 171 lb 12.8 oz (77.9 kg)  08/24/22 168 lb 6.4 oz (76.4 kg)  09/23/20 179 lb (81.2 kg)    BP Readings from Last 3 Encounters:  08/28/22 (!) 175/108  08/24/22 (!) 152/96  07/25/22 (!) 153/113     Physical Exam Vitals and nursing note reviewed.  Constitutional:      General: He is not in acute distress.     Appearance: Normal appearance. He is not toxic-appearing.  HENT:     Head: Normocephalic and atraumatic.     Right Ear: External ear normal.     Left Ear: External ear normal.  Eyes:     Extraocular Movements: Extraocular movements intact.     Conjunctiva/sclera: Conjunctivae normal.     Pupils: Pupils are equal, round, and reactive to light.  Cardiovascular:     Rate and Rhythm: Normal rate and regular rhythm.     Pulses: Normal pulses.     Heart sounds: Normal heart sounds.  Pulmonary:     Effort: Pulmonary effort is normal.     Breath sounds: Normal breath sounds.  Musculoskeletal:     Cervical back: Normal range of motion and neck supple.     Right lower leg: No edema.     Left lower leg: No edema.  Skin:    General: Skin is warm and dry.  Neurological:     General: No focal deficit present.  Mental Status: He is alert and oriented to person, place, and time.  Psychiatric:        Mood and Affect: Mood normal.        Behavior: Behavior normal.        Assessment & Plan:  Essential hypertension Assessment & Plan: Elevated, no signs of end organ damage, no symptoms Cont amlodipine 10 mg Continue hydrochlorothiazide 12.5 mg Track at home Lifestyle habits to help with BP control  Close f/up in 2 weeks    Abnormal thyroid stimulating hormone level -     Thyroid Panel With TSH -     TRAb (TSH Receptor Binding Antibody) -     Thyroid Panel With TSH -     TRAb (TSH Receptor Binding Antibody)  Abnormal CBC -     CBC with Differential/Platelet -     Pathologist smear review -     CBC with Differential/Platelet -     Pathologist smear review  Elevated MCV -     CBC with Differential/Platelet -     Vitamin B12 -     CBC with Differential/Platelet -     Vitamin B12  Adverse effect of vaccine, initial encounter  --Mild reaction to Shingles vaccine, reassured this is common; no red flags to indicate that he will not be able to receive the 2nd shot. He is agreeable  and will plan for 2-6 months from now.  --Also reviewed recent labs with patient. Low TSH, abnormal CBC. Recheck labs today, treat pending results.    Return in about 2 weeks (around 09/11/2022) for blood pressure check.    Baxter Gonzalez M Brayden Betters, PA-C

## 2022-08-28 NOTE — Patient Instructions (Addendum)
I'm glad you're feeling better after the Shingles vaccine. We will keep this in mind when it comes due for the 2nd vaccine.  Your blood pressure is elevated. Be sure to take both blood pressure medications consistently. Keep a log at home. Follow-up with me in 2 weeks.   Recheck a few labs today.   Call sooner if any concerns.

## 2022-08-29 ENCOUNTER — Other Ambulatory Visit (INDEPENDENT_AMBULATORY_CARE_PROVIDER_SITE_OTHER): Payer: Managed Care, Other (non HMO)

## 2022-08-29 DIAGNOSIS — R718 Other abnormality of red blood cells: Secondary | ICD-10-CM | POA: Diagnosis not present

## 2022-08-29 DIAGNOSIS — R7989 Other specified abnormal findings of blood chemistry: Secondary | ICD-10-CM | POA: Diagnosis not present

## 2022-08-29 LAB — CBC WITH DIFFERENTIAL/PLATELET
Basophils Absolute: 0 10*3/uL (ref 0.0–0.1)
Basophils Relative: 0.5 % (ref 0.0–3.0)
Eosinophils Absolute: 0.1 10*3/uL (ref 0.0–0.7)
Eosinophils Relative: 1.3 % (ref 0.0–5.0)
HCT: 48.4 % (ref 39.0–52.0)
Hemoglobin: 16.3 g/dL (ref 13.0–17.0)
Lymphocytes Relative: 24.6 % (ref 12.0–46.0)
Lymphs Abs: 1.2 10*3/uL (ref 0.7–4.0)
MCHC: 33.6 g/dL (ref 30.0–36.0)
MCV: 100.7 fl — ABNORMAL HIGH (ref 78.0–100.0)
Monocytes Absolute: 0.4 10*3/uL (ref 0.1–1.0)
Monocytes Relative: 9.2 % (ref 3.0–12.0)
Neutro Abs: 3.1 10*3/uL (ref 1.4–7.7)
Neutrophils Relative %: 64.4 % (ref 43.0–77.0)
Platelets: 284 10*3/uL (ref 150.0–400.0)
RBC: 4.8 Mil/uL (ref 4.22–5.81)
RDW: 13.4 % (ref 11.5–15.5)
WBC: 4.9 10*3/uL (ref 4.0–10.5)

## 2022-08-29 LAB — VITAMIN B12: Vitamin B-12: 350 pg/mL (ref 211–911)

## 2022-08-29 NOTE — Addendum Note (Signed)
Addended byZoe Lan on: 08/29/2022 10:40 AM   Modules accepted: Orders

## 2022-08-29 NOTE — Assessment & Plan Note (Signed)
Elevated, no signs of end organ damage, no symptoms Cont amlodipine 10 mg Continue hydrochlorothiazide 12.5 mg Track at home Lifestyle habits to help with BP control  Close f/up in 2 weeks

## 2022-08-30 LAB — PATHOLOGIST SMEAR REVIEW

## 2022-08-30 LAB — THYROID PANEL WITH TSH: T4, Total: 7.4 ug/dL (ref 4.9–10.5)

## 2022-09-01 LAB — THYROID PANEL WITH TSH
Free Thyroxine Index: 2.4 (ref 1.4–3.8)
T3 Uptake: 33 % (ref 22–35)
TSH: 1.04 mIU/L (ref 0.40–4.50)

## 2022-09-01 LAB — TRAB (TSH RECEPTOR BINDING ANTIBODY): TRAB: <1 IU/L (ref <=2.00)

## 2022-09-11 ENCOUNTER — Ambulatory Visit (INDEPENDENT_AMBULATORY_CARE_PROVIDER_SITE_OTHER): Payer: Self-pay | Admitting: Physician Assistant

## 2022-09-11 ENCOUNTER — Encounter: Payer: Self-pay | Admitting: Physician Assistant

## 2022-09-11 VITALS — BP 124/84 | HR 82 | Temp 97.8°F | Ht 71.0 in | Wt 175.8 lb

## 2022-09-11 DIAGNOSIS — I10 Essential (primary) hypertension: Secondary | ICD-10-CM

## 2022-09-11 NOTE — Assessment & Plan Note (Signed)
Normotensive, finally, congratulated him  Cont amlodipine 10 mg Continue hydrochlorothiazide 12.5 mg Track at home Lifestyle habits to help with BP control

## 2022-09-11 NOTE — Progress Notes (Signed)
   Subjective:    Patient ID: Nicholas Richmond, male    DOB: [birth year], 62 y.o.   MRN: [id number]  Chief Complaint  Patient presents with   Hypertension    Pt in office for 2 wk bp follow up and check; pt states bp has been doing better and checking it twice daily. Pt needs to discuss lab results; pt had a reaction to shingles vaccine pt states felt jittery and didn't feel well.     Hypertension   Patient is in today for BP recheck. Feeling well, tracking at home - starting to run in 120s-130s/80s at home. Taking medication regularly. No side effects. No other concerns today.   Past Medical History:  Diagnosis Date   Asthma    Hemorrhoids    Hypertension     History reviewed. No pertinent surgical history.  Family History  Problem Relation Age of Onset   Colon cancer Neg Hx    Esophageal cancer Neg Hx    Rectal cancer Neg Hx    Stomach cancer Neg Hx     Social History   Tobacco Use   Smoking status: Never   Smokeless tobacco: Never  Vaping Use   Vaping Use: Never used  Substance Use Topics   Alcohol use: Yes    Alcohol/week: 3.0 standard drinks of alcohol    Types: 3 Cans of beer per week    Comment: occasional   Drug use: Yes    Types: Marijuana     No Known Allergies  Review of Systems NEGATIVE UNLESS OTHERWISE INDICATED IN HPI      Objective:     BP 124/84 (BP Location: Left Arm)   Pulse 82   Temp 97.8 F (36.6 C) (Temporal)   Ht 5\' 11"  (1.803 m)   Wt 175 lb 12.8 oz (79.7 kg)   SpO2 99%   BMI 24.52 kg/m   Wt Readings from Last 3 Encounters:  09/11/22 175 lb 12.8 oz (79.7 kg)  08/28/22 171 lb 12.8 oz (77.9 kg)  08/24/22 168 lb 6.4 oz (76.4 kg)    BP Readings from Last 3 Encounters:  09/11/22 124/84  08/28/22 (!) 175/108  08/24/22 (!) 152/96     Physical Exam Vitals and nursing note reviewed.  Constitutional:      Appearance: Normal appearance.  Cardiovascular:     Rate and Rhythm: Normal rate and regular rhythm.     Pulses: Normal  pulses.     Heart sounds: No murmur heard. Pulmonary:     Effort: Pulmonary effort is normal.     Breath sounds: Normal breath sounds.  Musculoskeletal:     Right lower leg: No edema.     Left lower leg: No edema.  Skin:    Findings: No rash.  Neurological:     Mental Status: He is alert.  Psychiatric:        Mood and Affect: Mood normal.        Assessment & Plan:  Essential hypertension Assessment & Plan: Normotensive, finally, congratulated him  Cont amlodipine 10 mg Continue hydrochlorothiazide 12.5 mg Track at home Lifestyle habits to help with BP control        Return in about 5 months (around 02/11/2023) for CPE, fasting labs .    Garlin Batdorf M Bexlee Bergdoll, PA-C

## 2022-09-25 ENCOUNTER — Ambulatory Visit: Payer: Managed Care, Other (non HMO) | Admitting: Physician Assistant

## 2022-10-15 ENCOUNTER — Other Ambulatory Visit: Payer: Self-pay | Admitting: Physician Assistant

## 2022-10-15 DIAGNOSIS — I10 Essential (primary) hypertension: Secondary | ICD-10-CM

## 2022-10-30 ENCOUNTER — Encounter: Payer: Self-pay | Admitting: Gastroenterology

## 2022-10-30 ENCOUNTER — Ambulatory Visit: Payer: Managed Care, Other (non HMO) | Admitting: Gastroenterology

## 2022-10-30 VITALS — BP 160/100 | HR 68 | Ht 71.0 in | Wt 169.0 lb

## 2022-10-30 DIAGNOSIS — K6289 Other specified diseases of anus and rectum: Secondary | ICD-10-CM

## 2022-10-30 DIAGNOSIS — K642 Third degree hemorrhoids: Secondary | ICD-10-CM | POA: Diagnosis not present

## 2022-10-30 NOTE — Patient Instructions (Signed)
You have been scheduled for an appointment with ________________ at [facility]. Your appointment is on ______________ at _________________. Please arrive at _______________ for registration. Make certain to bring a list of current medications, including any over the counter medications or vitamins. Also bring your co-pay if you have one as well as your insurance cards. [facility] is located at [address], [address]. Should you need to reschedule your appointment, please contact them at [phone].   _______________________________________________________  If your blood pressure at your visit was 140/90 or greater, please contact your primary care physician to follow up on this.  _______________________________________________________  If you are age 60 or older, your body mass index should be between 23-30. Your Body mass index is 23.57 kg/m. If this is out of the aforementioned range listed, please consider follow up with your Primary Care Provider.  If you are age 75 or younger, your body mass index should be between 19-25. Your Body mass index is 23.57 kg/m. If this is out of the aformentioned range listed, please consider follow up with your Primary Care Provider.   ________________________________________________________  The [facility] providers would like to encourage you to use [facility] to communicate with providers for non-urgent requests or questions.  Due to long hold times on the telephone, sending your provider a message by [facility] may be a faster and more efficient way to get a response.  Please allow 48 business hours for a response.  Please remember that this is for non-urgent requests.  _______________________________________________________   I appreciate the opportunity to care for you. Boone Master, PA-C

## 2022-10-30 NOTE — Progress Notes (Unsigned)
Chief Complaint: Rectal discomfort Primary GI MD: Dr. Adela Lank  HPI: 62 year old male with history of hypertension, Gilbert's syndrome, and others as listed below presents for evaluation of rectal prolapse.  Seen at urgent care 07/25/2022 for rectal pain and scant rectal bleeding.  Per chart review rectal prolapse was noted on physical exam without evidence of hemorrhoids or fissures and patient was referred to [place] for evaluation of his rectal prolapse.  Patient states he has had intermittent rectal discomfort for many years becoming progressively worse this year.  States it is not "pain" it is just uncomfortable. States he has had trouble using the bathroom and sometimes he will have loose stools and sometimes he has constipation.  He reports occasional rectal bleeding, scant bright red blood on tissue paper with wiping.  He feels like there is something protruding out of his anus.  Denies unintentional weight loss.  Denies abdominal pain.  Denies nausea/vomiting.  PREVIOUS GI WORKUP   Colonoscopy 03/2018 with Dr. Adela Lank for screening - Hemorrhoids found on perianal exam.  - One 3 mm polyp in the transverse colon, removed with a cold snare. Resected and retrieved.  - Diverticulosis in the transverse colon.  - Internal hemorrhoids.  - The examination was otherwise normal.  Diagnosis Surgical [P], transverse colon, polyp - COLONIC MUCOSA WITH BENIGN LYMPHOID AGGREGATE (ONE). - NO ADENOMATOUS CHANGE OR MALIGNANCY.  Past Medical History:  Diagnosis Date   Asthma    Hemorrhoids    Hypertension     Past Surgical History:  Procedure Laterality Date   COLONOSCOPY      Current Outpatient Medications  Medication Sig Dispense Refill   amLODipine (NORVASC) 10 MG tablet Take 1 tablet (10 mg total) by mouth daily. 90 tablet 3   hydrochlorothiazide (HYDRODIURIL) 12.5 MG tablet Take 1 tablet by mouth once daily 30 tablet 0   No current facility-administered medications for this  visit.    Allergies as of 10/30/2022   (No Known Allergies)    Family History  Problem Relation Age of Onset   Colon cancer Neg Hx    Esophageal cancer Neg Hx    Rectal cancer Neg Hx    Stomach cancer Neg Hx     Social History   Socioeconomic History   Marital status: Married    Spouse name: Not on file   Number of children: Not on file   Years of education: Not on file   Highest education level: 8th grade  Occupational History   Not on file  Tobacco Use   Smoking status: Never   Smokeless tobacco: Never  Vaping Use   Vaping status: Never Used  Substance and Sexual Activity   Alcohol use: Yes    Alcohol/week: 3.0 standard drinks of alcohol    Types: 3 Cans of beer per week    Comment: occasional   Drug use: Yes    Types: Marijuana   Sexual activity: Yes    Birth control/protection: None  Other Topics Concern   Not on file  Social History Narrative   Not on file   Social Determinants of Health   Financial Resource Strain: Medium Risk (09/10/2022)   Overall Financial Resource Strain (CARDIA)    Difficulty of Paying Living Expenses: Somewhat hard  Food Insecurity: Food Insecurity Present (09/10/2022)   Hunger Vital Sign    Worried About Running Out of Food in the Last Year: Sometimes true    Ran Out of Food in the Last Year: Sometimes true  Transportation Needs: Unmet  Transportation Needs (09/10/2022)   PRAPARE - Administrator, Civil Service (Medical): Yes    Lack of Transportation (Non-Medical): Yes  Physical Activity: Inactive (09/10/2022)   Exercise Vital Sign    Days of Exercise per Week: 5 days    Minutes of Exercise per Session: 0 min  Stress: Stress Concern Present (09/10/2022)   [employer] of Occupational Health - Occupational Stress Questionnaire    Feeling of Stress : Rather much  Social Connections: Moderately Isolated (09/10/2022)   Social Connection and Isolation Panel [NHANES]    Frequency of Communication with Friends and Family:  Three times a week    Frequency of Social Gatherings with Friends and Family: Once a week    Attends Religious Services: Never    Database administrator or Organizations: No    Attends Engineer, structural: Not on file    Marital Status: Married  Catering manager Violence: Low Risk  (06/17/2019)   Received from [employer]   Intimate Partner Violence    Insults You: Not on file    Threatens You: Not on file    Screams at [place]: Not on file    Physically Hurt: Not on file    Intimate Partner Violence Score: Not on file    Review of Systems:    Constitutional: No weight loss, fever, chills, weakness or fatigue HEENT: Eyes: No change in vision               Ears, Nose, Throat:  No change in hearing or congestion Skin: No rash or itching Cardiovascular: No chest pain, chest pressure or palpitations   Respiratory: No SOB or cough Gastrointestinal: See HPI and otherwise negative Genitourinary: No dysuria or change in urinary frequency Neurological: No headache, dizziness or syncope Musculoskeletal: No new muscle or joint pain Hematologic: No bleeding or bruising Psychiatric: No history of depression or anxiety    Physical Exam:  Vital signs: There were no vitals taken for this visit.  Constitutional: NAD, Well developed, Well nourished, alert and cooperative Head:  Normocephalic and atraumatic. Eyes:   PEERL, EOMI. No icterus. Conjunctiva pink. Respiratory: Respirations even and unlabored. Lungs clear to auscultation bilaterally.   No wheezes, crackles, or rhonchi.  Cardiovascular:  Regular rate and rhythm. No peripheral edema, cyanosis or pallor.  Gastrointestinal:  Soft, nondistended, nontender. No rebound or guarding. Normal bowel sounds. No appreciable masses or hepatomegaly. Rectal:    Media Information  Document Information  Photos    10/30/2022 15:05  Attached To:  Office Visit on 10/30/22 with Legrand Como, PA-C  Source  Information  Siani Utke, Saddie Benders, [place]  [facility]  Document History    Msk:  Symmetrical without gross deformities. Without edema, no deformity or joint abnormality.  Neurologic:  Alert and  oriented x4;  grossly normal neurologically.  Skin:   Dry and intact without significant lesions or rashes. Psychiatric: Oriented to person, place and time. Demonstrates good judgement and reason without abnormal affect or behaviors.   RELEVANT LABS AND IMAGING: CBC    Component Value Date/Time   WBC 4.9 08/29/2022 1041   RBC 4.80 08/29/2022 1041   HGB 16.3 08/29/2022 1041   HCT 48.4 08/29/2022 1041   PLT 284.0 08/29/2022 1041   MCV 100.7 (H) 08/29/2022 1041   MCH 34.0 09/23/2020 1716   MCHC 33.6 08/29/2022 1041   RDW 13.4 08/29/2022 1041   LYMPHSABS 1.2 08/29/2022 1041   MONOABS 0.4 08/29/2022 1041  EOSABS 0.1 08/29/2022 1041   BASOSABS 0.0 08/29/2022 1041    CMP     Component Value Date/Time   NA 141 08/24/2022 0849   K 4.3 08/24/2022 0849   CL 110 08/24/2022 0849   CO2 25 08/24/2022 0849   GLUCOSE 108 (H) 08/24/2022 0849   BUN 18 08/24/2022 0849   CREATININE 1.21 08/24/2022 0849   CALCIUM 9.8 08/24/2022 0849   PROT 6.4 08/24/2022 0849   ALBUMIN 3.7 08/24/2022 0849   AST 18 08/24/2022 0849   ALT 18 08/24/2022 0849   ALKPHOS 69 08/24/2022 0849   BILITOT 0.6 08/24/2022 0849   GFRNONAA >60 09/23/2020 1716   GFRAA >60 10/02/2019 1345     Assessment/Plan:   Rectal discomfort Hemorrhoids See physical exam picture. nontender, nonbleeding, able to be manually reduced. --- please do Sitz baths --- benefiber/metamucil daily --- add in miralax 1 capful per day and titrate based on response. Discussed preventing constipation in detail --- set up OV with Dr. Adela Lank (first available) for banding. --- if no improvement prior to appt with Dr. Adela Lank we can try OTC recticare versus hydrocortisone suppositories.  Lara [facility]ch [facility] 10/30/2022, 11:06 AM  Cc: Allwardt, Crist Infante, PA-C

## 2022-10-31 ENCOUNTER — Encounter: Payer: Self-pay | Admitting: Gastroenterology

## 2022-10-31 NOTE — Progress Notes (Signed)
Agree with assessment and plan as outlined.  Looks like this may more likely represent grade 3 hemorrhoids rather than rectal prolapse?  If that is the case he may be amenable to hemorrhoid banding.  I will reassess him in the office after trial of conservative measures.

## 2022-11-01 NOTE — Progress Notes (Signed)
Spoke with patient to advise of upcoming appointment to discuss hemorrhoidal banding/get hem banding #1 if appropriate on 11/22/22 at 1130. Patient verbalizes understanding and is in agreement with this plan.

## 2022-11-01 NOTE — Progress Notes (Signed)
-----   Message -----  From: Jearl Klinefelter  Sent: 10/31/2022   2:38 PM EDT  To: Richardson Chiquito, RN  Subject: banding                                        Please schedule banding with Dr. Adela Lank! Thanks! :)

## 2022-11-01 NOTE — Progress Notes (Signed)
Patient has been scheduled for discussion/hemorrhoidal banding #1 with Dr Adela Lank on 11/22/22 at 1130 am. I have left a voicemail for patient to call back.

## 2022-11-14 ENCOUNTER — Other Ambulatory Visit: Payer: Self-pay | Admitting: Physician Assistant

## 2022-11-14 DIAGNOSIS — I10 Essential (primary) hypertension: Secondary | ICD-10-CM

## 2022-11-22 ENCOUNTER — Ambulatory Visit: Payer: Managed Care, Other (non HMO) | Admitting: Gastroenterology

## 2022-11-22 ENCOUNTER — Encounter: Payer: Self-pay | Admitting: Gastroenterology

## 2022-11-22 VITALS — BP 110/78 | HR 82 | Ht 71.0 in | Wt 177.0 lb

## 2022-11-22 DIAGNOSIS — R194 Change in bowel habit: Secondary | ICD-10-CM

## 2022-11-22 DIAGNOSIS — K643 Fourth degree hemorrhoids: Secondary | ICD-10-CM | POA: Diagnosis not present

## 2022-11-22 DIAGNOSIS — R152 Fecal urgency: Secondary | ICD-10-CM

## 2022-11-22 MED ORDER — HYDROCORTISONE 1 % EX OINT: 1.0000 | TOPICAL_OINTMENT | Freq: Two times a day (BID) | CUTANEOUS | 0 refills | Status: DC

## 2022-11-22 MED ORDER — NA SULFATE-K SULFATE-MG SULF 17.5-3.13-1.6 GM/177ML PO SOLN: 1.0000 | Freq: Once | ORAL | 0 refills | Status: AC

## 2022-11-22 NOTE — Patient Instructions (Signed)
_______________________________________________________  If your blood pressure at your visit was 140/90 or greater, please contact your primary care physician to follow up on this.  _______________________________________________________  If you are age 60 or older, your body mass index should be between 23-30. Your Body mass index is 24.69 kg/m. If this is out of the aforementioned range listed, please consider follow up with your Primary Care Provider.  If you are age 27 or younger, your body mass index should be between 19-25. Your Body mass index is 24.69 kg/m. If this is out of the aformentioned range listed, please consider follow up with your Primary Care Provider.   ________________________________________________________  The [facility] providers would like to encourage you to use [facility] to communicate with providers for non-urgent requests or questions.  Due to long hold times on the telephone, sending your provider a message by [facility] may be a faster and more efficient way to get a response.  Please allow 48 business hours for a response.  Please remember that this is for non-urgent requests.  _______________________________________________________  Nicholas Richmond have been scheduled for a flexible sigmoidoscopy. Please follow the written instructions given to you at your visit today.  If you use inhalers (even only as needed), please bring them with you on the day of your procedure.  DO NOT TAKE 7 DAYS PRIOR TO TEST- Trulicity (dulaglutide) Ozempic, Wegovy (semaglutide) Mounjaro (tirzepatide) Bydureon Bcise (exanatide extended release)  DO NOT TAKE 1 DAY PRIOR TO YOUR TEST Rybelsus (semaglutide) Adlyxin (lixisenatide) Victoza (liraglutide) Byetta (exanatide) ___________________________________________________________________________  Due to recent changes in healthcare laws, you may see the results of your imaging and laboratory studies on MyChart before your provider has had a  chance to review them.  We understand that in some cases there may be results that are confusing or concerning to you. Not all laboratory results come back in the same time frame and the provider may be waiting for multiple results in order to interpret others.  Please give [place] 48 hours in order for your provider to thoroughly review all the results before contacting the office for clarification of your results.   Use the Calmol 4 suppositories as needed.   Use hydrocortisone 1% twice daily.   Continue sitz baths   It was a pleasure to see you today!  Thank you for trusting me with your gastrointestinal care!

## 2022-11-22 NOTE — Progress Notes (Signed)
HPI :  62 year old male here for a follow-up visit to discuss altered bowel habits and prolapse of tissue from his rectum.  He was seen in our office by Boone Master on 8/26.   At that time he was complaining of rectal discomfort with some bleeding and some prolapse of tissue over the past several months.  On her exam, was not sure if this is prolapsed hemorrhoids versus rectal prolapse.  He was referred here today for further evaluation and possible hemorrhoid banding.  He was told to take Metamucil daily which he has been doing, use sitz bath as needed in the interim.  He states he has been doing that but has not noticed much change in his symptoms.  Upon questioning him about his bowel habits, he has endorsed significant urgency and sense of incomplete evacuation for several months.  He states it takes about 3 trips to the bathroom to feel evacuated and he is having about 9-10 bowel movements per day, has significant urgency that can wake him up at times.  He continues to have rectal discomfort with bowel movements and scant bleeding.  He feels tissue from his rectum that is protruding at all times at this point and he cannot reduce it.  He states is been ongoing for several months.  His last colonoscopy with me is below in 2020, no high risk lesions. Last CBC shows no anemia.   PREVIOUS GI WORKUP    Colonoscopy 03/2018 for screening - Hemorrhoids found on perianal exam.  - One 3 mm polyp in the transverse colon, removed with a cold snare. Resected and retrieved.  - Diverticulosis in the transverse colon.  - Internal hemorrhoids.  - The examination was otherwise normal.   Diagnosis Surgical [P], transverse colon, polyp - COLONIC MUCOSA WITH BENIGN LYMPHOID AGGREGATE (ONE). - NO ADENOMATOUS CHANGE OR MALIGNANCY.    Past Medical History:  Diagnosis Date   Asthma    Hemorrhoids    Hypertension      Past Surgical History:  Procedure Laterality Date   COLONOSCOPY      Family History  Problem Relation Age of Onset   Colon cancer Neg Hx    Esophageal cancer Neg Hx    Liver disease Neg Hx    Social History   Tobacco Use   Smoking status: Never   Smokeless tobacco: Never  Vaping Use   Vaping status: Never Used  Substance Use Topics   Alcohol use: Yes    Alcohol/week: 3.0 standard drinks of alcohol    Types: 3 Cans of beer per week    Comment: occasional   Drug use: Yes    Types: Marijuana   Current Outpatient Medications  Medication Sig Dispense Refill   hydrochlorothiazide (HYDRODIURIL) 12.5 MG tablet Take 1 tablet by mouth once daily 30 tablet 0   amLODipine (NORVASC) 10 MG tablet Take 1 tablet (10 mg total) by mouth daily. 90 tablet 3   No current facility-administered medications for this visit.   No Known Allergies   Review of Systems: All systems reviewed and negative except where noted in HPI.    Lab Results  Component Value Date   WBC 4.9 08/29/2022   HGB 16.3 08/29/2022   HCT 48.4 08/29/2022   MCV 100.7 (H) 08/29/2022   PLT 284.0 08/29/2022     Physical Exam: BP 110/78   Pulse 82   Ht 5\' 11"  (1.803 m)   Wt 177 lb (80.3 kg)   BMI 24.69 kg/m  Constitutional:  Pleasant,well-developed, male in no acute distress. DRE - June McMurray CMA standby - prolapsed grade IV hemorrhoids, discomfort with DRE, no anoscopy performed, no mass lesions appreciated Extremities: no edema Neurological: Alert and oriented to person place and time. Psychiatric: Normal mood and affect. Behavior is normal.   ASSESSMENT: 62 y.o. male here for assessment of the following  1. Change in bowel habits   2. Fecal urgency   3. Grade IV hemorrhoids    On exam I think he more than likely has grade 4 hemorrhoids that are chronically prolapsed and this does not seem to be overtly a rectal prolapse based on exam today.  I cannot reduce the hemorrhoids and he has discomfort when trying to do that as well as with DRE in general.  If he is going to  get treatment for his hemorrhoids I think he may be beyond banding at this point and would likely need hemorrhoidectomy.  However, prior to referring him to general surgery, he is having significant urgency and frequency as outlined.  His symptoms are most concerning for proctitis which could potentially be making his hemorrhoidal symptoms worse.  Discussed options, recommend flex sig to further evaluate his symptoms and rule out inflammatory changes or problems with his rectum prior to having him see surgery.  We discussed what this would entail.  I do not think he could tolerate enema therapy to prepare for this and he is agreeable to do a full bowel prep for this exam.  Further recommendations pending the results of his flex sig.  He will continue sitz bath's in the interim, I recommend he try Calmol 4 suppositories to help with irritation, he can also apply 1% hydrocortisone cream PR twice daily to the hemorrhoids in the interim.  PLAN: - schedule for flex sig / colonoscopy 10/2 - first available - rule out proctitis, scheduled 10/2 - trial of Calmol4 suppositories samples to use PRN - Hydrocortisone 1% cream - applied to hemorrhoids BID - continue Sitz baths - may need hemorrhoidectomy / colorectal surgery evaluation to treat hemorrhoids but need to rule out proctitis / IBD first  Harlin Rain, MD [facility]

## 2022-11-29 ENCOUNTER — Ambulatory Visit: Payer: Managed Care, Other (non HMO) | Admitting: Internal Medicine

## 2022-11-29 ENCOUNTER — Emergency Department (HOSPITAL_BASED_OUTPATIENT_CLINIC_OR_DEPARTMENT_OTHER)
Admission: EM | Admit: 2022-11-29 | Discharge: 2022-11-29 | Disposition: A | Discharge status: Home / Self Care | Payer: Managed Care, Other (non HMO) | Attending: Emergency Medicine | Admitting: Emergency Medicine

## 2022-11-29 ENCOUNTER — Emergency Department (HOSPITAL_BASED_OUTPATIENT_CLINIC_OR_DEPARTMENT_OTHER): Payer: Managed Care, Other (non HMO)

## 2022-11-29 ENCOUNTER — Other Ambulatory Visit: Payer: Self-pay

## 2022-11-29 DIAGNOSIS — Z79899 Other long term (current) drug therapy: Secondary | ICD-10-CM | POA: Insufficient documentation

## 2022-11-29 DIAGNOSIS — R1110 Vomiting, unspecified: Secondary | ICD-10-CM | POA: Diagnosis not present

## 2022-11-29 DIAGNOSIS — R1114 Bilious vomiting: Secondary | ICD-10-CM

## 2022-11-29 DIAGNOSIS — R112 Nausea with vomiting, unspecified: Secondary | ICD-10-CM

## 2022-11-29 DIAGNOSIS — Z1152 Encounter for screening for COVID-19: Secondary | ICD-10-CM | POA: Diagnosis not present

## 2022-11-29 DIAGNOSIS — I1A0 Resistant hypertension: Secondary | ICD-10-CM | POA: Insufficient documentation

## 2022-11-29 DIAGNOSIS — R319 Hematuria, unspecified: Secondary | ICD-10-CM | POA: Diagnosis not present

## 2022-11-29 DIAGNOSIS — R1112 Projectile vomiting: Secondary | ICD-10-CM | POA: Diagnosis not present

## 2022-11-29 DIAGNOSIS — R6883 Chills (without fever): Secondary | ICD-10-CM | POA: Diagnosis not present

## 2022-11-29 LAB — URINALYSIS, ROUTINE W REFLEX MICROSCOPIC
Bacteria, UA: NONE SEEN
Bilirubin Urine: NEGATIVE
Glucose, UA: NEGATIVE mg/dL
Ketones, ur: NEGATIVE mg/dL
Leukocytes,Ua: NEGATIVE
Nitrite: NEGATIVE
Protein, ur: 30 mg/dL — AB
Specific Gravity, Urine: 1.021 (ref 1.005–1.030)
pH: 7.5 (ref 5.0–8.0)

## 2022-11-29 LAB — COMPREHENSIVE METABOLIC PANEL
ALT: 15 U/L (ref 0–44)
AST: 16 U/L (ref 15–41)
Albumin: 4.9 g/dL (ref 3.5–5.0)
Alkaline Phosphatase: 47 U/L (ref 38–126)
Anion gap: 14 (ref 5–15)
BUN: 14 mg/dL (ref 6–20)
CO2: 21 mmol/L — ABNORMAL LOW (ref 22–32)
Calcium: 10.4 mg/dL — ABNORMAL HIGH (ref 8.9–10.3)
Chloride: 104 mmol/L (ref 98–111)
Creatinine, Ser: 1.03 mg/dL (ref 0.61–1.24)
GFR, Estimated: >60 mL/min (ref >60)
Glucose, Bld: 153 mg/dL — ABNORMAL HIGH (ref 70–99)
Potassium: 3.8 mmol/L (ref 3.5–5.1)
Sodium: 139 mmol/L (ref 135–145)
Total Bilirubin: 1.3 mg/dL — ABNORMAL HIGH (ref 0.3–1.2)
Total Protein: 8.6 g/dL — ABNORMAL HIGH (ref 6.5–8.1)

## 2022-11-29 LAB — CBC
HCT: 45.5 % (ref 39.0–52.0)
Hemoglobin: 16.5 g/dL (ref 13.0–17.0)
MCH: 34 pg (ref 26.0–34.0)
MCHC: 36.3 g/dL — ABNORMAL HIGH (ref 30.0–36.0)
MCV: 93.8 fL (ref 80.0–100.0)
Platelets: 330 10*3/uL (ref 150–400)
RBC: 4.85 MIL/uL (ref 4.22–5.81)
RDW: 12.7 % (ref 11.5–15.5)
WBC: 8.6 10*3/uL (ref 4.0–10.5)
nRBC: 0 % (ref 0.0–0.2)

## 2022-11-29 LAB — CBG MONITORING, ED: Glucose-Capillary: 163 mg/dL — ABNORMAL HIGH (ref 70–99)

## 2022-11-29 LAB — SARS CORONAVIRUS 2 BY RT PCR: SARS Coronavirus 2 by RT PCR: NEGATIVE

## 2022-11-29 LAB — LIPASE, BLOOD: Lipase: 26 U/L (ref 11–51)

## 2022-11-29 MED ORDER — ONDANSETRON HCL 4 MG PO TABS: 4.0000 mg | ORAL_TABLET | Freq: Three times a day (TID) | ORAL | 0 refills | Status: DC | PRN

## 2022-11-29 MED ORDER — ONDANSETRON HCL 4 MG/2ML IJ SOLN
4.0000 mg | Freq: Once | INTRAMUSCULAR | Status: AC
Start: 2022-11-29 — End: 2022-11-29
  Administered 2022-11-29: 4 mg via INTRAMUSCULAR

## 2022-11-29 MED ORDER — PROMETHAZINE HCL 25 MG RE SUPP: 25.0000 mg | Freq: Four times a day (QID) | RECTAL | 0 refills | Status: DC | PRN

## 2022-11-29 MED ORDER — ONDANSETRON HCL 4 MG/2ML IJ SOLN
4.0000 mg | Freq: Once | INTRAMUSCULAR | Status: AC
  Administered 2022-11-29: 4 mg via INTRAVENOUS
  Filled 2022-11-29: qty 2

## 2022-11-29 MED ORDER — SODIUM CHLORIDE 0.9 % IV BOLUS
1000.0000 mL | Freq: Once | INTRAVENOUS | Status: AC
  Administered 2022-11-29: 1000 mL via INTRAVENOUS

## 2022-11-29 MED ORDER — ONDANSETRON HCL 4 MG/2ML IJ SOLN
4.0000 mg | Freq: Once | INTRAMUSCULAR | Status: DC
Start: 2022-11-30 — End: 2022-11-29

## 2022-11-29 MED ORDER — PANTOPRAZOLE SODIUM 40 MG IV SOLR
40.0000 mg | Freq: Once | INTRAVENOUS | Status: AC
  Administered 2022-11-29: 40 mg via INTRAVENOUS
  Filled 2022-11-29: qty 10

## 2022-11-29 MED ORDER — ONDANSETRON HCL 4 MG/2ML IJ SOLN
4.0000 mg | Freq: Once | INTRAMUSCULAR | Status: AC | PRN
  Administered 2022-11-29: 4 mg via INTRAVENOUS
  Filled 2022-11-29: qty 2

## 2022-11-29 NOTE — ED Triage Notes (Signed)
Pt arrived POV from PCP c/o N/V, abd pain and chills since this morning. Pt reports he began colonoscopy prep today which consists of not being able to eat solid food and has not had much to drink since yesterday either d/t consistent vomiting.

## 2022-11-29 NOTE — Progress Notes (Signed)
Nicholas Richmond: [phone]   -- Medical Office Visit --  Patient:  Nicholas Richmond      Age: 62 y.o.       Sex:  male  Date:   11/29/2022 Patient Care Team: Allwardt, Crist Infante, PA-C as PCP - General (Physician Assistant) Today's Healthcare Provider: Lula Olszewski, MD      Assessment & Plan Bilious vomiting with nausea Doubt virus. Non stop back to back vomiting bilious fluid in office. We quickly gave Zofran injection and sent to nearest emergency room for further evaluation and monitoring.  Reports that he is not in withdrawal from any substances Retching  Projectile vomiting with nausea  Intractable vomiting with nausea  Assessment and Plan    Acute onset nausea and vomiting He experienced sudden projectile vomiting and retching since 9:30 AM, with yellow vomitus indicating possible gallbladder pathology. We administered a Zofran injection and referred him immediately to the ER for further evaluation, including abdominal imaging and IV fluids.  Back pain He has had ongoing back pain since Saturday, with an unclear etiology. We will reassess after the acute vomiting episode resolves.  Upcoming colonoscopy His colonoscopy is scheduled for October 2nd, and he is aware of the dietary restrictions for today. We will ensure the ER is informed of the upcoming procedure.      Diagnoses and all orders for this visit: Bilious vomiting with nausea -     ondansetron (ZOFRAN) 4 MG tablet; Take 1 tablet (4 mg total) by mouth every 8 (eight) hours as needed for nausea or vomiting. -     promethazine (PHENERGAN) 25 MG suppository; Place 1 suppository (25 mg total) rectally every 6 (six) hours as needed for nausea or vomiting. -     ondansetron (ZOFRAN) injection 4 mg Retching -     ondansetron (ZOFRAN) 4 MG tablet; Take 1 tablet (4 mg total) by mouth every 8 (eight) hours as needed for nausea or vomiting. -     promethazine (PHENERGAN) 25 MG suppository; Place 1  suppository (25 mg total) rectally every 6 (six) hours as needed for nausea or vomiting. -     ondansetron (ZOFRAN) injection 4 mg Projectile vomiting with nausea Intractable vomiting with nausea  Recommended follow-up: after emergency room  Future Appointments  Date Time Provider Department Center  12/06/2022  4:00 PM Armbruster, Willaim Rayas, MD [facility]  01/11/2023 11:00 AM Armbruster, Willaim Rayas, MD [facility]  03/13/2023  9:00 AM Allwardt, Crist Infante, PA-C [facility]            Subjective   62 y.o. male who has Essential hypertension; Fatty liver; Gilbert syndrome; Lipoma of back; Prediabetes; Vaccine reaction, sequela; and Rectal prolapse on their problem list. His reasons/main concerns/chief complaints for today's office visit are No chief complaint on file.   ------------------------------------------------------------------------------------------------------------------------ AI-Extracted: Discussed the use of AI scribe software for clinical note transcription with the patient, who gave verbal consent to proceed.  History of Present Illness   The patient, [name], born in [birth year], presented with sudden onset of severe vomiting and retching that started around 9:30 AM on the day of the consultation. The patient reported feeling sick, hot, and sweaty upon waking up. He also complained of back pain that had been persisting since the previous Saturday. The patient denied any recent changes in medication and was scheduled for a colonoscopy the following day, for which he was advised to avoid solid foods. The patient had no theory for the sudden onset  of symptoms, although his wife suggested it might be a virus. The patient's vomit was described as yellow in color. The patient was not from the local area and was unfamiliar with the local medical facilities.      He has a past medical history of Asthma, Hemorrhoids, and Hypertension.  Problem list overviews that were updated at  today's visit: No problems updated. Current Outpatient Medications on File Prior to Visit  Medication Sig   amLODipine (NORVASC) 10 MG tablet Take 1 tablet (10 mg total) by mouth daily.   hydrochlorothiazide (HYDRODIURIL) 12.5 MG tablet Take 1 tablet by mouth once daily   hydrocortisone 1 % ointment Apply 1 Application topically 2 (two) times daily.   No current facility-administered medications on file prior to visit.  There are no discontinued medications.   Objective   Physical Exam  There were no vitals taken for this visit. Patient couldn't stop vomiting throughout visit. Wt Readings from Last 10 Encounters:  11/22/22 177 lb (80.3 kg)  10/30/22 169 lb (76.7 kg)  09/11/22 175 lb 12.8 oz (79.7 kg)  08/28/22 171 lb 12.8 oz (77.9 kg)  08/24/22 168 lb 6.4 oz (76.4 kg)  09/23/20 179 lb (81.2 kg)  10/02/19 175 lb (79.4 kg)  03/25/18 176 lb (79.8 kg)  03/11/18 176 lb 6.4 oz (80 kg)  05/23/17 175 lb (79.4 kg)   Vital signs reviewed.  Nursing notes reviewed. Weight trend reviewed. Abnormalities and Problem-Specific physical exam findings:  constant vomitign throughout appointment. Nausea. Hardly able to speak due to vomiting.  General Appearance: Acute distress appreciable.   Well-groomed, sick appearing male.  Well proportioned with no abnormal fat distribution.  Good muscle tone. Pulmonary:  Normal work of breathing at rest, no respiratory distress apparent.    Musculoskeletal: All extremities are intact.  Neurological:  Awake, alert, oriented, and engaged.  No obvious focal neurological deficits or cognitive impairments.  Sensorium seems unclouded.   Speech is clear and coherent with logical content. Psychiatric:  Appropriate mood, pleasant and cooperative demeanor, thoughtful and engaged during the exam  Results            No results found for any visits on 11/29/22.  Lab on 08/29/2022  Component Date Value   Vitamin B-12 08/29/2022 350    WBC 08/29/2022 4.9    RBC  08/29/2022 4.80    Hemoglobin 08/29/2022 16.3    HCT 08/29/2022 48.4    MCV 08/29/2022 100.7 (H)    MCHC 08/29/2022 33.6    RDW 08/29/2022 13.4    Platelets 08/29/2022 284.0    Neutrophils Relative % 08/29/2022 64.4    Lymphocytes Relative 08/29/2022 24.6    Monocytes Relative 08/29/2022 9.2    Eosinophils Relative 08/29/2022 1.3    Basophils Relative 08/29/2022 0.5    Neutro Abs 08/29/2022 3.1    Lymphs Abs 08/29/2022 1.2    Monocytes Absolute 08/29/2022 0.4    Eosinophils Absolute 08/29/2022 0.1    Basophils Absolute 08/29/2022 0.0    TRAB 08/29/2022 <1.00    T3 Uptake 08/29/2022 33    T4, Total 08/29/2022 7.4    Free Thyroxine Index 08/29/2022 2.4    TSH 08/29/2022 1.04    Path Review 08/29/2022    Office Visit on 08/24/2022  Component Date Value   WBC 08/24/2022 3.8 (L)    RBC 08/24/2022 4.18 (L)    Hemoglobin 08/24/2022 14.3    HCT 08/24/2022 42.4    MCV 08/24/2022 101.4 (H)    MCHC 08/24/2022 33.8  RDW 08/24/2022 13.2    Platelets 08/24/2022 292.0    Neutrophils Relative % 08/24/2022 58.5    Lymphocytes Relative 08/24/2022 28.9    Monocytes Relative 08/24/2022 10.4    Eosinophils Relative 08/24/2022 1.3    Basophils Relative 08/24/2022 0.9    Neutro Abs 08/24/2022 2.2    Lymphs Abs 08/24/2022 1.1    Monocytes Absolute 08/24/2022 0.4    Eosinophils Absolute 08/24/2022 0.0    Basophils Absolute 08/24/2022 0.0    Sodium 08/24/2022 141    Potassium 08/24/2022 4.3    Chloride 08/24/2022 110    CO2 08/24/2022 25    Glucose, Bld 08/24/2022 108 (H)    BUN 08/24/2022 18    Creatinine, Ser 08/24/2022 1.21    Total Bilirubin 08/24/2022 0.6    Alkaline Phosphatase 08/24/2022 69    AST 08/24/2022 18    ALT 08/24/2022 18    Total Protein 08/24/2022 6.4    Albumin 08/24/2022 3.7    GFR 08/24/2022 65.43    Calcium 08/24/2022 9.8    Hgb A1c MFr Bld 08/24/2022 5.6    Cholesterol 08/24/2022 117    Triglycerides 08/24/2022 133.0    HDL 08/24/2022 45.90    VLDL  08/24/2022 26.6    LDL Cholesterol 08/24/2022 45    Total CHOL/HDL Ratio 08/24/2022 3    NonHDL 08/24/2022 71.10    PSA 08/24/2022 1.86    TSH 08/24/2022 0.25 (L)    Hepatitis C Ab 08/24/2022 NON-REACTIVE    No image results found.   No results found.  No results found.     Additional Info: This encounter employed real-time, collaborative documentation. The patient actively reviewed and updated their medical record on a shared screen, ensuring transparency and facilitating joint problem-solving for the problem list, overview, and plan. This approach promotes accurate, informed care. The treatment plan was discussed and reviewed in detail, including medication safety, potential side effects, and all patient questions. We confirmed understanding and comfort with the plan. Follow-up instructions were established, including contacting the office for any concerns, returning if symptoms worsen, persist, or new symptoms develop, and precautions for potential emergency department visits.

## 2022-11-29 NOTE — Patient Instructions (Addendum)
VISIT SUMMARY:  Dear Nicholas Richmond, during your visit, we discussed your sudden onset of severe vomiting and retching, your ongoing back pain, and your upcoming colonoscopy. We believe your vomiting may be related to a possible gallbladder issue, but we need further tests to confirm this. Your back pain will be reassessed once your vomiting issue is resolved. We also discussed the dietary restrictions for your upcoming colonoscopy.  YOUR PLAN:  -ACUTE ONSET NAUSEA AND VOMITING: Your sudden vomiting and retching could be due to a problem with your gallbladder, which is an organ that helps digest fats. We gave you a medication called Zofran to help with the nausea and vomiting, and we referred you to the emergency room for further tests and treatment.  -BACK PAIN: You've been experiencing back pain since last Saturday. We're not sure what's causing it yet, but we'll look into it more once your vomiting issue is resolved.  -UPCOMING COLONOSCOPY: You have a colonoscopy scheduled for October 2nd. This is a procedure that allows [place] to look inside your colon for any abnormalities. You're aware of the dietary restrictions for today, and we'll make sure the emergency room is informed of your upcoming procedure.  INSTRUCTIONS:  Please proceed to the emergency room as advised for further evaluation and treatment. They will conduct abdominal imaging and provide you with IV fluids. Also, remember to follow the dietary restrictions for your colonoscopy. We will reassess your back pain once your vomiting issue is resolved.

## 2022-11-29 NOTE — Addendum Note (Signed)
Addended by: Donzetta Starch on: 11/29/2022 04:55 PM   Modules accepted: Orders

## 2022-11-29 NOTE — ED Provider Notes (Signed)
EMERGENCY DEPARTMENT AT [facility] Provider Note   CSN: [id number] Arrival date & time: 11/29/22  1611     History {Add pertinent medical, surgical, social history, OB history to HPI:1} Chief Complaint  Patient presents with   Emesis    Nicholas Richmond is a 62 y.o. male.  He has had persistent vomiting since this morning.  Nonbloody.  Saw his PCP who sent him here for further evaluation.  Not having any abdominal pain maybe has a little bit of back pain.  He has not having any diarrhea.  He said he just started doing a colonoscopy prep today but has taken this medication before.  Colonoscopy is scheduled for October 2.  They are going to scope him because he has had problems with incomplete emptying and hemorrhoids.  Denies any fevers chills.  The history is provided by the patient.  Emesis Severity:  Moderate Duration:  8 hours Timing:  Constant Quality:  Bilious material Progression:  Unchanged Chronicity:  New Recent urination:  Normal Relieved by:  Nothing Worsened by:  Nothing Ineffective treatments:  None tried Associated symptoms: chills   Associated symptoms: no abdominal pain, no diarrhea, no fever and no sore throat   Risk factors: no sick contacts, no suspect food intake and no travel to endemic areas        Home Medications Prior to Admission medications   Medication Sig Start Date End Date Taking? Authorizing Provider  amLODipine (NORVASC) 10 MG tablet Take 1 tablet (10 mg total) by mouth daily. 08/24/22 10/30/22  Allwardt, Crist Infante, PA-C  hydrochlorothiazide (HYDRODIURIL) 12.5 MG tablet Take 1 tablet by mouth once daily 11/14/22   Allwardt, Alyssa M, PA-C  hydrocortisone 1 % ointment Apply 1 Application topically 2 (two) times daily. 11/22/22   Armbruster, Willaim Rayas, MD  ondansetron (ZOFRAN) 4 MG tablet Take 1 tablet (4 mg total) by mouth every 8 (eight) hours as needed for nausea or vomiting. 11/29/22   Lula Olszewski, MD  promethazine  (PHENERGAN) 25 MG suppository Place 1 suppository (25 mg total) rectally every 6 (six) hours as needed for nausea or vomiting. 11/29/22   Lula Olszewski, MD      Allergies    Patient has no known allergies.    Review of Systems   Review of Systems  Constitutional:  Positive for chills. Negative for fever.  HENT:  Negative for sore throat.   Respiratory:  Negative for shortness of breath.   Cardiovascular:  Negative for chest pain.  Gastrointestinal:  Positive for nausea and vomiting. Negative for abdominal pain and diarrhea.  Genitourinary:  Negative for dysuria.  Skin:  Negative for rash.    Physical Exam Updated Vital Signs BP (!) 189/131 (BP Location: Left Arm)   Pulse 88   Temp 98.3 F (36.8 C)   Resp 19   SpO2 100%  Physical Exam Vitals and nursing note reviewed.  Constitutional:      Appearance: Normal appearance. He is well-developed.  HENT:     Head: Normocephalic and atraumatic.  Eyes:     Conjunctiva/sclera: Conjunctivae normal.  Cardiovascular:     Rate and Rhythm: Normal rate and regular rhythm.     Heart sounds: No murmur heard. Pulmonary:     Effort: Pulmonary effort is normal. No respiratory distress.     Breath sounds: Normal breath sounds.  Abdominal:     Palpations: Abdomen is soft.     Tenderness: There is no abdominal tenderness. There is no guarding  or rebound.  Musculoskeletal:        General: No deformity.     Cervical back: Neck supple.  Skin:    General: Skin is warm and dry.  Neurological:     General: No focal deficit present.     Mental Status: He is alert.     GCS: GCS eye subscore is 4. GCS verbal subscore is 5. GCS motor subscore is 6.     ED Results / Procedures / Treatments   Labs (all labs ordered are listed, but only abnormal results are displayed) Labs Reviewed  COMPREHENSIVE METABOLIC PANEL - Abnormal; Notable for the following components:      Result Value   CO2 21 (*)    Glucose, Bld 153 (*)    Calcium 10.4 (*)     Total Protein 8.6 (*)    Total Bilirubin 1.3 (*)    All other components within normal limits  CBC - Abnormal; Notable for the following components:   MCHC 36.3 (*)    All other components within normal limits  URINALYSIS, ROUTINE W REFLEX MICROSCOPIC - Abnormal; Notable for the following components:   Hgb urine dipstick MODERATE (*)    Protein, ur 30 (*)    All other components within normal limits  CBG MONITORING, ED - Abnormal; Notable for the following components:   Glucose-Capillary 163 (*)    All other components within normal limits  SARS CORONAVIRUS 2 BY RT PCR  LIPASE, BLOOD    EKG None  Radiology No results found.  Procedures Procedures  {Document cardiac monitor, telemetry assessment procedure when appropriate:1}  Medications Ordered in ED Medications  sodium chloride 0.9 % bolus 1,000 mL (has no administration in time range)  ondansetron (ZOFRAN) injection 4 mg (has no administration in time range)  pantoprazole (PROTONIX) injection 40 mg (has no administration in time range)  ondansetron (ZOFRAN) injection 4 mg (4 mg Intravenous Given 11/29/22 1729)    ED Course/ Medical Decision Making/ A&P   {   Click here for ABCD2, HEART and other calculatorsREFRESH Note before signing :1}                              Medical Decision Making Amount and/or Complexity of Data Reviewed Labs: ordered.  Risk Prescription drug management.   This patient complains of ***; this involves an extensive number of treatment Options and is a complaint that carries with it a high risk of complications and morbidity. The differential includes ***  I ordered, reviewed and interpreted labs, which included *** I ordered medication *** and reviewed PMP when indicated. I ordered imaging studies which included *** and I independently    visualized and interpreted imaging which showed *** Additional history obtained from *** Previous records obtained and reviewed *** I consulted ***  and discussed lab and imaging findings and discussed disposition.  Cardiac monitoring reviewed, *** Social determinants considered, *** Critical Interventions: ***  After the interventions stated above, I reevaluated the patient and found *** Admission and further testing considered, ***   {Document critical care time when appropriate:1} {Document review of labs and clinical decision tools ie heart score, Chads2Vasc2 etc:1}  {Document your independent review of radiology images, and any outside records:1} {Document your discussion with family members, caretakers, and with consultants:1} {Document social determinants of health affecting pt's care:1} {Document your decision making why or why not admission, treatments were needed:1} Final Clinical Impression(s) / ED Diagnoses Final  diagnoses:  None    Rx / DC Orders ED Discharge Orders     None

## 2022-11-29 NOTE — ED Triage Notes (Signed)
Pt caox4 arrived from PCP. Last ate yesterday. Cold and sweaty with abd pain and vomiting this morning.  Colonoscopy scheduled for Tuesday

## 2022-12-06 ENCOUNTER — Encounter: Payer: Self-pay | Admitting: Gastroenterology

## 2022-12-06 ENCOUNTER — Ambulatory Visit: Payer: Managed Care, Other (non HMO) | Admitting: Gastroenterology

## 2022-12-06 VITALS — BP 113/87 | HR 77 | Temp 97.0°F | Resp 10 | Ht 71.0 in | Wt 169.0 lb

## 2022-12-06 DIAGNOSIS — R194 Change in bowel habit: Secondary | ICD-10-CM | POA: Diagnosis present

## 2022-12-06 MED ORDER — SODIUM CHLORIDE 0.9 % IV SOLN: 500.0000 mL | Freq: Once | INTRAVENOUS | Status: DC

## 2022-12-06 NOTE — Op Note (Signed)
[facility] Patient Name: Nicholas Richmond Procedure Date: 12/06/2022 3:32 PM MRN: [id number] Endoscopist: Viviann Spare P. Adela Lank , MD, [id number] Age: 62 Referring MD:  Date of Birth: May 21, 1960 Gender: Male Account #: [id number] Procedure:                Flexible Sigmoidoscopy Indications:              Change in bowel habits - urgency / frequency,                            inflamed grade III hemorrhoids Medicines:                Monitored Anesthesia Care Procedure:                Pre-Anesthesia Assessment:                           - Prior to the procedure, a History and Physical                            was performed, and patient medications and                            allergies were reviewed. The patient's tolerance of                            previous anesthesia was also reviewed. The risks                            and benefits of the procedure and the sedation                            options and risks were discussed with the patient.                            All questions were answered, and informed consent                            was obtained. Prior Anticoagulants: The patient has                            taken no anticoagulant or antiplatelet agents. ASA                            Grade Assessment: II - A patient with mild systemic                            disease. After reviewing the risks and benefits,                            the patient was deemed in satisfactory condition to                            undergo the procedure.  After obtaining informed consent, the scope was                            passed under direct vision. The Olympus Scope                            [id number] was introduced through the anus and                            advanced to the the sigmoid colon. The flexible                            sigmoidoscopy was accomplished without difficulty.                            The patient tolerated the  procedure well. The                            quality of the bowel preparation was good. Scope In: 3:41:47 PM Scope Out: 3:46:09 PM Total Procedure Duration: 0 hours 4 minutes 22 seconds  Findings:                 Grade III prolapsed hemorrhoids were found on                            perianal exam and were able to be reduced.                           The rectum, recto-sigmoid colon and sigmoid colon                            appeared normal. No evidence of inflammation or                            proctitis.                           Internal hemorrhoids were found. Complications:            No immediate complications. Estimated blood loss:                            None. Estimated Blood Loss:     Estimated blood loss: none. Impression:               - Grade III prolapased hemorrhoids found on                            perianal exam amd reduced.                           - The rectum, sigmoid colon and recto-sigmoid colon                            are normal.                           -  Internal hemorrhoids.                           No evidence of IBD / proctitis. Recommendation:           - Discharge patient to home.                           - Resume previous diet.                           - Continue present medications.                           - Daily fiber supplement - continue Metamucil                           - Continue Sitz baths                           - Continue Calmol4 suppositories                           - Continue hydrocortisone ointment PRN                           - Referral to colorectal surgery for treatment of                            hemorrhoids Anthony Roland P. Cal[place]onia Leap, MD 12/06/2022 3:59:39 PM This report has been signed electronically.

## 2022-12-06 NOTE — Progress Notes (Unsigned)
Uneventful anesthetic. Report to pacu rn. Vss. Care resumed by rn. 

## 2022-12-06 NOTE — Patient Instructions (Signed)
Discharge instructions given. Resume previous diet. Continue present medications. Daily fiber supplement-continue Metamucil. Continue Sitz baths. Continue Calmol 4 suppositories. Continue hydrocortisone ointment prn. Office will schedule referral to Colorectal surgery for treatment of hemorrhoids. YOU HAD AN ENDOSCOPIC PROCEDURE TODAY AT THE [facility]:   Refer to the procedure report that was given to you for any specific questions about what was found during the examination.  If the procedure report does not answer your questions, please call your gastroenterologist to clarify.  If you requested that your care partner not be given the details of your procedure findings, then the procedure report has been included in a sealed envelope for you to review at your convenience later.  YOU SHOULD EXPECT: Some feelings of bloating in the abdomen. Passage of more gas than usual.  Walking can help get rid of the air that was put into your GI tract during the procedure and reduce the bloating. If you had a lower endoscopy (such as a colonoscopy or flexible sigmoidoscopy) you may notice spotting of blood in your stool or on the toilet paper. If you underwent a bowel prep for your procedure, you may not have a normal bowel movement for a few days.  Please Note:  You might notice some irritation and congestion in your nose or some drainage.  This is from the oxygen used during your procedure.  There is no need for concern and it should clear up in a day or so.  SYMPTOMS TO REPORT IMMEDIATELY:  Following lower endoscopy (colonoscopy or flexible sigmoidoscopy):  Excessive amounts of blood in the stool  Significant tenderness or worsening of abdominal pains  Swelling of the abdomen that is new, acute  Fever of 100F or higher   For urgent or emergent issues, a gastroenterologist can be reached at any hour by calling [phone]. Do not use MyChart messaging for urgent concerns.    DIET:   We do recommend a small meal at first, but then you may proceed to your regular diet.  Drink plenty of fluids but you should avoid alcoholic beverages for 24 hours.  ACTIVITY:  You should plan to take it easy for the rest of today and you should NOT DRIVE or use heavy machinery until tomorrow (because of the sedation medicines used during the test).    FOLLOW UP: Our staff will call the number listed on your records the next business day following your procedure.  We will call around 7:15- 8:00 am to check on you and address any questions or concerns that you may have regarding the information given to you following your procedure. If we do not reach you, we will leave a message.     If any biopsies were taken you will be contacted by phone or by letter within the next 1-3 weeks.  Please call us at [phone] if you have not heard about the biopsies in 3 weeks.    SIGNATURES/CONFIDENTIALITY: You and/or your care partner have signed paperwork which will be entered into your electronic medical record.  These signatures attest to the fact that that the information above on your After Visit Summary has been reviewed and is understood.  Full responsibility of the confidentiality of this discharge information lies with you and/or your care-partner.

## 2022-12-06 NOTE — Progress Notes (Unsigned)
History and Physical Interval Note: Seen on 11/22/22 without interval changes. Grade IV hemorrhoids however has urgency and increased frequency of stools. Flex sig to rule out proctitis. Using Sitz Baths, added Calmol 4 suppositories and 1 % hydrocortisone cream in the interim. He agrees with the plan.   12/06/2022 3:36 PM  Nicholas Richmond  has presented today for endoscopic procedure(s), with the diagnosis of  Encounter Diagnosis  Name Primary?   Change in bowel habits Yes  .  The various methods of evaluation and treatment have been discussed with the patient and/or family. After consideration of risks, benefits and other options for treatment, the patient has consented to  the endoscopic procedure(s).   The patient's history has been reviewed, patient examined, no change in status, stable for surgery.  I have reviewed the patient's chart and labs.  Questions were answered to the patient's satisfaction.    Harlin Rain, MD [facility]

## 2022-12-07 ENCOUNTER — Telehealth: Payer: Self-pay

## 2022-12-07 NOTE — Telephone Encounter (Signed)
  Follow up Call-     12/06/2022    2:56 PM  Call back number  Post procedure Call Back phone  # [phone]  Permission to leave phone message Yes     Patient questions:  Do you have a fever, pain , or abdominal swelling? No. Pain Score  0 *  Have you tolerated food without any problems? Yes.    Have you been able to return to your normal activities? Yes.    Do you have any questions about your discharge instructions: Diet   No. Medications  No. Follow up visit  No.  Do you have questions or concerns about your Care? No.  Actions: * If pain score is 4 or above: No action needed, pain <4.

## 2022-12-07 NOTE — Telephone Encounter (Signed)
Referral faxed to [facility] 

## 2022-12-07 NOTE — Telephone Encounter (Signed)
-----   Message from Benancio Deeds sent at 12/06/2022  4:04 PM EDT ----- Regarding: referral to surgery [name] can you please refer this patient to general surgery for hemorrhoidectomy? Thanks

## 2022-12-08 NOTE — Telephone Encounter (Signed)
Patent called  stated he noticed in MyChart he is scheduled for another banding procedure however he was also referred to [facility]. Patient is now confused please clarify.

## 2022-12-11 NOTE — Telephone Encounter (Signed)
MyChart message sent to patient.  Banding appointment has been cancelled since we referred patient to [facility] for hemorrhoidectomy.

## 2022-12-17 ENCOUNTER — Other Ambulatory Visit: Payer: Self-pay | Admitting: Physician Assistant

## 2022-12-17 DIAGNOSIS — I10 Essential (primary) hypertension: Secondary | ICD-10-CM

## 2023-01-11 ENCOUNTER — Encounter: Payer: Managed Care, Other (non HMO) | Admitting: Gastroenterology

## 2023-01-15 ENCOUNTER — Other Ambulatory Visit: Payer: Self-pay | Admitting: Physician Assistant

## 2023-01-15 DIAGNOSIS — I10 Essential (primary) hypertension: Secondary | ICD-10-CM

## 2023-02-10 ENCOUNTER — Other Ambulatory Visit: Payer: Self-pay | Admitting: Physician Assistant

## 2023-02-10 DIAGNOSIS — I10 Essential (primary) hypertension: Secondary | ICD-10-CM

## 2023-02-15 ENCOUNTER — Ambulatory Visit: Payer: Self-pay | Admitting: Surgery

## 2023-02-15 NOTE — Patient Instructions (Addendum)
SURGICAL WAITING ROOM VISITATION Patients having surgery or a procedure may have no more than 2 support people in the waiting area - these visitors may rotate in the visitor waiting room.   Due to an increase in RSV and influenza rates and associated hospitalizations, children ages 51 and under may not visit patients in [facility] hospitals. If the patient needs to stay at the hospital during part of their recovery, the visitor guidelines for inpatient rooms apply.  PRE-OP VISITATION  Pre-op nurse will coordinate an appropriate time for 1 support person to accompany the patient in pre-op.  This support person may not rotate.  This visitor will be contacted when the time is appropriate for the visitor to come back in the pre-op area.  Please refer to the [facility] website for the visitor guidelines for Inpatients (after your surgery is over and you are in a regular room).  You are not required to quarantine at this time prior to your surgery. However, you must do this: Hand Hygiene often Do NOT share personal items Notify your provider if you are in close contact with someone who has COVID or you develop fever 100.4 or greater, new onset of sneezing, cough, sore throat, shortness of breath or body aches.  If you test positive for Covid or have been in contact with anyone that has tested positive in the last 10 days please notify you surgeon.    Your procedure is scheduled on:  Monday  February 19, 2023  Report to [facility] Main Entrance: [place] entrance where the [employer] is available.   Report to admitting at: 11:45    AM  Call this number if you have any questions or problems the morning of surgery [phone]  Do not eat food after Midnight the night prior to your surgery/procedure.  After Midnight you may have the following liquids until  1100  AM DAY OF SURGERY  Clear Liquid Diet Water Black Coffee (sugar ok, NO MILK/CREAM OR CREAMERS)  Tea (sugar ok, NO  MILK/CREAM OR CREAMERS) regular and decaf                             Plain Jell-O  with no fruit (NO RED)                                           Fruit ices (not with fruit pulp, NO RED)                                     Popsicles (NO RED)                                                                  Juice: NO CITRUS JUICES: only apple, WHITE grape, WHITE cranberry Sports drinks like Gatorade or Powerade (NO RED)              FOLLOW ANY ADDITIONAL PRE OP INSTRUCTIONS YOU RECEIVED FROM YOUR SURGEON'S OFFICE!!!   FLEET ENEMA: Obtain one(1) Fleet Enema (sodium phosphate 7-19 gm /  118 ml enema) and use (according to the directions on the box) the night prior to your surgery prior to your CHF Shower    2.    FLEET ENEMA: Obtain one(1) Fleet Enema (sodium phosphate 7-19 gm / 118 ml enema) and use (according to the directions on the box) the MORNING OF YOUR SURGERY Prior to your CHG shower.   If you have any questions, please contact your Surgeon's office for additional information.    Oral Hygiene is also important to reduce your risk of infection.        Remember - BRUSH YOUR TEETH THE MORNING OF SURGERY WITH YOUR REGULAR TOOTHPASTE  Do NOT smoke after Midnight the night before surgery.  STOP TAKING all Vitamins, Herbs and supplements 1 week before your surgery.   Take ONLY these medicines the morning of surgery with A SIP OF WATER: amlodipine                    You may not have any metal on your body including  jewelry, and body piercing  Do not wear , lotions, powders,cologne, or deodorant  Men may shave face and neck.  Contacts, Hearing Aids, dentures or bridgework may not be worn into surgery. DENTURES WILL BE REMOVED PRIOR TO SURGERY PLEASE DO NOT APPLY "Poly grip" OR ADHESIVES!!!   Patients discharged on the day of surgery will not be allowed to drive home.  Someone NEEDS to stay with you for the first 24 hours after anesthesia.  Do not bring your home medications to  the hospital. The Pharmacy will dispense medications listed on your medication list to you during your admission in the Hospital.  Please read over the following fact sheets you were given: IF YOU HAVE QUESTIONS ABOUT YOUR PRE-OP INSTRUCTIONS, PLEASE CALL [phone]   [facility] - Preparing for Surgery Before surgery, you can play an important role.  Because skin is not sterile, your skin needs to be as free of germs as possible.  You can reduce the number of germs on your skin by washing with CHG (chlorahexidine gluconate) soap before surgery.  CHG is an antiseptic cleaner which kills germs and bonds with the skin to continue killing germs even after washing. Please DO NOT use if you have an allergy to CHG or antibacterial soaps.  If your skin becomes reddened/irritated stop using the CHG and inform your nurse when you arrive at Short Stay. Do not shave (including legs and underarms) for at least 48 hours prior to the first CHG shower.  You may shave your face/neck.  Please follow these instructions carefully:  1.  Shower with CHG Soap the night before surgery and the  morning of surgery.  2.  If you choose to wash your hair, wash your hair first as usual with your normal  shampoo.  3.  After you shampoo, rinse your hair and body thoroughly to remove the shampoo.                             4.  Use CHG as you would any other liquid soap.  You can apply chg directly to the skin and wash.  Gently with a scrungie or clean washcloth.  5.  Apply the CHG Soap to your body ONLY FROM THE NECK DOWN.   Do not use on face/ open  Wound or open sores. Avoid contact with eyes, ears mouth and genitals (private parts).                       Wash face,  Genitals (private parts) with your normal soap.             6.  Wash thoroughly, paying special attention to the area where your  surgery  will be performed.  7.  Thoroughly rinse your body with warm water from the neck down.  8.  DO  NOT shower/wash with your normal soap after using and rinsing off the CHG Soap.            9.  Pat yourself dry with a clean towel.            10.  Wear clean pajamas.            11.  Place clean sheets on your bed the night of your first shower and do not  sleep with pets.  ON THE DAY OF SURGERY : Do not apply any lotions/deodorants the morning of surgery.  Please wear clean clothes to the hospital/surgery center.     FAILURE TO FOLLOW THESE INSTRUCTIONS MAY RESULT IN THE CANCELLATION OF YOUR SURGERY  PATIENT SIGNATURE_________________________________  NURSE SIGNATURE__________________________________  ________________________________________________________________________

## 2023-02-15 NOTE — Progress Notes (Signed)
Sent message, via epic in basket, requesting orders in epic from surgeon.  

## 2023-02-15 NOTE — Progress Notes (Addendum)
COVID Vaccine received:  []  No [x]  Yes Date of any COVID positive Test in last 90 days:  none  PCP - Alyssa Allwardt, PA-C [phone] (Work)  [phone] (Fax)  Cardiologist - none  Chest x-ray - 09-23-2020  2v  Epic EKG -  09-24-2020 Epic    02-16-23 Epic Stress Test -  ECHO -  Cardiac Cath -   PCR screen: []  Ordered & Completed []   No Order but Needs PROFEND     [x]   N/A for this surgery  Surgery Plan:  [x]  Ambulatory   []  Outpatient in bed  []  Admit Anesthesia:    [x]  General  []  Spinal  []   Choice []   MAC  Bowel Prep - []  No  [x]   Yes _2 fleet enema   patient is aware  Pacemaker / ICD device [x]  No []  Yes   Spinal Cord Stimulator:[x]  No []  Yes       History of Sleep Apnea? [x]  No []  Yes   CPAP used?- [x]  No []  Yes    Does the patient monitor blood sugar?   []  N/A   [x]  No []  Yes  Patient has: []  NO Hx DM   [x]  Pre-DM   []  DM1  []   DM2 Last A1c was:  5.6  on 08-24-22    Blood Thinner / Instructions:  none Aspirin Instructions:  none  ERAS Protocol Ordered: []  No  [x]  Yes per Special needs.   PRE-SURGERY []  ENSURE  []  G2   [x]  No Drink Ordered Patient is to be NPO after: 1100  Dental hx: []  Dentures:  []  N/A      []  Bridge or Partial:                   [x]  Loose or Damaged teeth: right upper canine tooth is cracked  Activity level: Patient is able to climb a flight of stairs without difficulty; [x]  No CP  [x]  No SOB  Patient can  perform ADLs without assistance.   Anesthesia review: HTN, Asthma, Pre-DM, fatty liver  Patient denies shortness of breath, fever, cough and chest pain at PAT appointment.  Patient verbalized understanding and agreement to the Pre-Surgical Instructions that were given to them at this PAT appointment. Patient was also educated of the need to review these PAT instructions again prior to his surgery.I reviewed the appropriate phone numbers to call if they have any and questions or concerns.

## 2023-02-16 ENCOUNTER — Encounter (HOSPITAL_COMMUNITY): Payer: Self-pay

## 2023-02-16 ENCOUNTER — Encounter (HOSPITAL_COMMUNITY)
Admission: RE | Admit: 2023-02-16 | Discharge: 2023-02-16 | Disposition: A | Discharge status: Home / Self Care | Payer: Managed Care, Other (non HMO) | Source: Ambulatory Visit | Attending: Surgery | Admitting: Surgery

## 2023-02-16 ENCOUNTER — Other Ambulatory Visit: Payer: Self-pay

## 2023-02-16 VITALS — BP 122/90 | HR 69 | Temp 98.3°F | Resp 14 | Ht 71.0 in | Wt 170.6 lb

## 2023-02-16 DIAGNOSIS — Z01818 Encounter for other preprocedural examination: Secondary | ICD-10-CM | POA: Diagnosis present

## 2023-02-16 DIAGNOSIS — I10 Essential (primary) hypertension: Secondary | ICD-10-CM | POA: Insufficient documentation

## 2023-02-16 DIAGNOSIS — K760 Fatty (change of) liver, not elsewhere classified: Secondary | ICD-10-CM | POA: Insufficient documentation

## 2023-02-16 HISTORY — DX: Fatty (change of) liver, not elsewhere classified: K76.0

## 2023-02-16 HISTORY — DX: Prediabetes: R73.03

## 2023-02-16 HISTORY — DX: Other psychoactive substance abuse, uncomplicated: F19.10

## 2023-02-16 HISTORY — DX: Unspecified osteoarthritis, unspecified site: M19.90

## 2023-02-16 LAB — COMPREHENSIVE METABOLIC PANEL
ALT: 16 U/L (ref 0–44)
AST: 14 U/L — ABNORMAL LOW (ref 15–41)
Albumin: 3.8 g/dL (ref 3.5–5.0)
Alkaline Phosphatase: 54 U/L (ref 38–126)
Anion gap: 5 (ref 5–15)
BUN: 23 mg/dL — ABNORMAL HIGH (ref 6–20)
CO2: 22 mmol/L (ref 22–32)
Calcium: 9.1 mg/dL (ref 8.9–10.3)
Chloride: 109 mmol/L (ref 98–111)
Creatinine, Ser: 1.33 mg/dL — ABNORMAL HIGH (ref 0.61–1.24)
GFR, Estimated: >60 mL/min (ref >60)
Glucose, Bld: 119 mg/dL — ABNORMAL HIGH (ref 70–99)
Potassium: 3.6 mmol/L (ref 3.5–5.1)
Sodium: 136 mmol/L (ref 135–145)
Total Bilirubin: 1 mg/dL (ref <1.2)
Total Protein: 7 g/dL (ref 6.5–8.1)

## 2023-02-16 LAB — CBC
HCT: 43.4 % (ref 39.0–52.0)
Hemoglobin: 14.8 g/dL (ref 13.0–17.0)
MCH: 34.1 pg — ABNORMAL HIGH (ref 26.0–34.0)
MCHC: 34.1 g/dL (ref 30.0–36.0)
MCV: 100 fL (ref 80.0–100.0)
Platelets: 356 10*3/uL (ref 150–400)
RBC: 4.34 MIL/uL (ref 4.22–5.81)
RDW: 12.7 % (ref 11.5–15.5)
WBC: 3.9 10*3/uL — ABNORMAL LOW (ref 4.0–10.5)
nRBC: 0 % (ref 0.0–0.2)

## 2023-02-17 NOTE — Anesthesia Preprocedure Evaluation (Signed)
Anesthesia Evaluation  Patient identified by MRN, date of birth, ID band Patient awake    Reviewed: Allergy & Precautions, NPO status , Patient's Chart, lab work & pertinent test results  Airway Mallampati: II  TM Distance: >3 FB Neck ROM: Full    Dental no notable dental hx. (+) Chipped, Dental Advisory Given, Teeth Intact,    Pulmonary    Pulmonary exam normal breath sounds clear to auscultation       Cardiovascular hypertension, Pt. on medications Normal cardiovascular exam Rhythm:Regular Rate:Normal     Neuro/Psych negative neurological ROS  negative psych ROS   GI/Hepatic negative GI ROS, Neg liver ROS,,,  Endo/Other  diabetes    Renal/GU Lab Results      Component                Value               Date                       K                        3.6                 02/16/2023                    BUN                      23 (H)              02/16/2023                CREATININE               1.33 (H)            02/16/2023                  GLUCOSE                  119 (H)             02/16/2023                Musculoskeletal   Abdominal   Peds  Hematology Lab Results      Component                Value               Date                      WBC                      3.9 (L)             02/16/2023                HGB                      14.8                02/16/2023                HCT                      43.4  02/16/2023                MCV                      100.0               02/16/2023                PLT                      356                 02/16/2023              Anesthesia Other Findings   Reproductive/Obstetrics                             Anesthesia Physical Anesthesia Plan  ASA: 3  Anesthesia Plan: General   Post-op Pain Management: Tylenol PO (pre-op)* and Precedex   Induction: Intravenous  PONV Risk Score and Plan: 3 and Treatment may vary  due to age or medical condition, Midazolam and Ondansetron  Airway Management Planned: Oral ETT  Additional Equipment: None  Intra-op Plan:   Post-operative Plan: Extubation in OR  Informed Consent: I have reviewed the patients History and Physical, chart, labs and discussed the procedure including the risks, benefits and alternatives for the proposed anesthesia with the patient or authorized representative who has indicated his/her understanding and acceptance.     Dental advisory given  Plan Discussed with: CRNA and Anesthesiologist  Anesthesia Plan Comments:         Anesthesia Quick Evaluation

## 2023-02-19 ENCOUNTER — Encounter (HOSPITAL_COMMUNITY)
Admission: RE | Disposition: A | Discharge status: Home / Self Care | Payer: Self-pay | Source: Home / Self Care | Attending: Surgery

## 2023-02-19 ENCOUNTER — Ambulatory Visit (HOSPITAL_COMMUNITY)
Admission: RE | Admit: 2023-02-19 | Discharge: 2023-02-19 | Disposition: A | Discharge status: Home / Self Care | Payer: Managed Care, Other (non HMO) | Attending: Surgery | Admitting: Surgery

## 2023-02-19 ENCOUNTER — Other Ambulatory Visit: Payer: Self-pay

## 2023-02-19 ENCOUNTER — Ambulatory Visit (HOSPITAL_BASED_OUTPATIENT_CLINIC_OR_DEPARTMENT_OTHER): Payer: Managed Care, Other (non HMO) | Admitting: Anesthesiology

## 2023-02-19 ENCOUNTER — Ambulatory Visit (HOSPITAL_COMMUNITY): Payer: Self-pay | Admitting: Anesthesiology

## 2023-02-19 ENCOUNTER — Encounter (HOSPITAL_COMMUNITY): Payer: Self-pay | Admitting: Surgery

## 2023-02-19 DIAGNOSIS — K642 Third degree hemorrhoids: Secondary | ICD-10-CM | POA: Diagnosis not present

## 2023-02-19 DIAGNOSIS — Z79899 Other long term (current) drug therapy: Secondary | ICD-10-CM | POA: Diagnosis not present

## 2023-02-19 DIAGNOSIS — K648 Other hemorrhoids: Secondary | ICD-10-CM | POA: Insufficient documentation

## 2023-02-19 DIAGNOSIS — I10 Essential (primary) hypertension: Secondary | ICD-10-CM | POA: Insufficient documentation

## 2023-02-19 HISTORY — PX: RECTAL EXAM UNDER ANESTHESIA: SHX6399

## 2023-02-19 HISTORY — PX: HEMORRHOID SURGERY: SHX153

## 2023-02-19 SURGERY — HEMORRHOIDECTOMY
Anesthesia: General

## 2023-02-19 MED ORDER — FLEET ENEMA RE ENEM: 1.0000 | ENEMA | Freq: Once | RECTAL | Status: DC

## 2023-02-19 MED ORDER — ACETAMINOPHEN 500 MG PO TABS: 1000.0000 mg | ORAL_TABLET | ORAL | Status: DC

## 2023-02-19 MED ORDER — DEXMEDETOMIDINE HCL IN NACL 80 MCG/20ML IV SOLN
INTRAVENOUS | Status: DC | PRN
  Administered 2023-02-19: 12 ug via INTRAVENOUS
  Administered 2023-02-19: 8 ug via INTRAVENOUS

## 2023-02-19 MED ORDER — ONDANSETRON HCL 4 MG/2ML IJ SOLN: 4.0000 mg | Freq: Once | INTRAMUSCULAR | Status: DC | PRN

## 2023-02-19 MED ORDER — ACETAMINOPHEN 500 MG PO TABS
1000.0000 mg | ORAL_TABLET | Freq: Once | ORAL | Status: AC
  Administered 2023-02-19: 1000 mg via ORAL
  Filled 2023-02-19: qty 2

## 2023-02-19 MED ORDER — BUPIVACAINE-EPINEPHRINE 0.25% -1:200000 IJ SOLN
INTRAMUSCULAR | Status: DC | PRN
  Administered 2023-02-19: 30 mL

## 2023-02-19 MED ORDER — HYDRALAZINE HCL 20 MG/ML IJ SOLN
INTRAMUSCULAR | Status: AC
  Filled 2023-02-19: qty 1

## 2023-02-19 MED ORDER — ONDANSETRON HCL 4 MG/2ML IJ SOLN
INTRAMUSCULAR | Status: DC | PRN
  Administered 2023-02-19: 4 mg via INTRAVENOUS

## 2023-02-19 MED ORDER — FENTANYL CITRATE (PF) 100 MCG/2ML IJ SOLN
INTRAMUSCULAR | Status: AC
  Filled 2023-02-19: qty 2

## 2023-02-19 MED ORDER — CHLORHEXIDINE GLUCONATE CLOTH 2 % EX PADS: 6.0000 | MEDICATED_PAD | Freq: Once | CUTANEOUS | Status: DC

## 2023-02-19 MED ORDER — ONDANSETRON HCL 4 MG/2ML IJ SOLN
INTRAMUSCULAR | Status: AC
  Filled 2023-02-19: qty 2

## 2023-02-19 MED ORDER — DEXAMETHASONE SODIUM PHOSPHATE 10 MG/ML IJ SOLN
INTRAMUSCULAR | Status: AC
  Filled 2023-02-19: qty 1

## 2023-02-19 MED ORDER — OXYCODONE HCL 5 MG PO TABS: 5.0000 mg | ORAL_TABLET | Freq: Once | ORAL | Status: DC | PRN

## 2023-02-19 MED ORDER — BUPIVACAINE LIPOSOME 1.3 % IJ SUSP: 20.0000 mL | Freq: Once | INTRAMUSCULAR | Status: DC

## 2023-02-19 MED ORDER — METOPROLOL TARTRATE 5 MG/5ML IV SOLN
2.0000 mg | Freq: Once | INTRAVENOUS | Status: AC
Start: 2023-02-19 — End: 2023-02-19
  Administered 2023-02-19: 2 mg via INTRAVENOUS

## 2023-02-19 MED ORDER — MIDAZOLAM HCL 2 MG/2ML IJ SOLN
INTRAMUSCULAR | Status: AC
Start: 2023-02-19 — End: ?
  Filled 2023-02-19: qty 2

## 2023-02-19 MED ORDER — LIDOCAINE HCL (PF) 2 % IJ SOLN
INTRAMUSCULAR | Status: AC
  Filled 2023-02-19: qty 5

## 2023-02-19 MED ORDER — HYDRALAZINE HCL 20 MG/ML IJ SOLN
5.0000 mg | Freq: Once | INTRAMUSCULAR | Status: AC
  Administered 2023-02-19: 5 mg via INTRAVENOUS

## 2023-02-19 MED ORDER — PROPOFOL 10 MG/ML IV BOLUS
INTRAVENOUS | Status: DC | PRN
  Administered 2023-02-19: 180 mg via INTRAVENOUS

## 2023-02-19 MED ORDER — HYDROMORPHONE HCL 1 MG/ML IJ SOLN
INTRAMUSCULAR | Status: DC | PRN
  Administered 2023-02-19 (×2): 1 mg via INTRAVENOUS

## 2023-02-19 MED ORDER — KETOROLAC TROMETHAMINE 30 MG/ML IJ SOLN: 30.0000 mg | Freq: Once | INTRAMUSCULAR | Status: DC | PRN

## 2023-02-19 MED ORDER — OXYCODONE HCL 5 MG/5ML PO SOLN
5.0000 mg | Freq: Once | ORAL | Status: DC | PRN
Start: 2023-02-19 — End: 2023-02-19

## 2023-02-19 MED ORDER — BUPIVACAINE-EPINEPHRINE 0.25% -1:200000 IJ SOLN
INTRAMUSCULAR | Status: AC
  Filled 2023-02-19: qty 1

## 2023-02-19 MED ORDER — LIDOCAINE HCL (CARDIAC) PF 100 MG/5ML IV SOSY
PREFILLED_SYRINGE | INTRAVENOUS | Status: DC | PRN
  Administered 2023-02-19: 100 mg via INTRAVENOUS

## 2023-02-19 MED ORDER — HYDROMORPHONE HCL 1 MG/ML IJ SOLN: 0.2500 mg | INTRAMUSCULAR | Status: DC | PRN

## 2023-02-19 MED ORDER — METOPROLOL TARTRATE 5 MG/5ML IV SOLN
INTRAVENOUS | Status: AC
  Filled 2023-02-19: qty 5

## 2023-02-19 MED ORDER — BUPIVACAINE LIPOSOME 1.3 % IJ SUSP
INTRAMUSCULAR | Status: DC | PRN
  Administered 2023-02-19: 20 mL

## 2023-02-19 MED ORDER — ROCURONIUM BROMIDE 10 MG/ML (PF) SYRINGE
PREFILLED_SYRINGE | INTRAVENOUS | Status: AC
  Filled 2023-02-19: qty 10

## 2023-02-19 MED ORDER — CHLORHEXIDINE GLUCONATE 0.12 % MT SOLN
15.0000 mL | Freq: Once | OROMUCOSAL | Status: AC
  Administered 2023-02-19: 15 mL via OROMUCOSAL

## 2023-02-19 MED ORDER — TRAMADOL HCL 50 MG PO TABS: 50.0000 mg | ORAL_TABLET | Freq: Four times a day (QID) | ORAL | 0 refills | Status: AC | PRN

## 2023-02-19 MED ORDER — LABETALOL HCL 5 MG/ML IV SOLN
INTRAVENOUS | Status: DC | PRN
  Administered 2023-02-19 (×2): 5 mg via INTRAVENOUS

## 2023-02-19 MED ORDER — DEXAMETHASONE SODIUM PHOSPHATE 10 MG/ML IJ SOLN
INTRAMUSCULAR | Status: DC | PRN
  Administered 2023-02-19: 5 mg via INTRAVENOUS

## 2023-02-19 MED ORDER — LACTATED RINGERS IV SOLN: INTRAVENOUS | Status: DC

## 2023-02-19 MED ORDER — MIDAZOLAM HCL 5 MG/5ML IJ SOLN
INTRAMUSCULAR | Status: DC | PRN
  Administered 2023-02-19 (×2): 1 mg via INTRAVENOUS

## 2023-02-19 MED ORDER — FENTANYL CITRATE (PF) 100 MCG/2ML IJ SOLN
INTRAMUSCULAR | Status: DC | PRN
  Administered 2023-02-19: 100 ug via INTRAVENOUS

## 2023-02-19 MED ORDER — DIBUCAINE (PERIANAL) 1 % EX OINT
TOPICAL_OINTMENT | CUTANEOUS | Status: AC
  Filled 2023-02-19: qty 28

## 2023-02-19 MED ORDER — HYDROMORPHONE HCL 2 MG/ML IJ SOLN
INTRAMUSCULAR | Status: AC
  Filled 2023-02-19: qty 1

## 2023-02-19 MED ORDER — ORAL CARE MOUTH RINSE: 15.0000 mL | Freq: Once | OROMUCOSAL | Status: AC

## 2023-02-19 MED ORDER — DIBUCAINE (PERIANAL) 1 % EX OINT
TOPICAL_OINTMENT | CUTANEOUS | Status: DC | PRN
  Administered 2023-02-19: 1 via RECTAL

## 2023-02-19 MED ORDER — LABETALOL HCL 5 MG/ML IV SOLN
INTRAVENOUS | Status: AC
  Filled 2023-02-19: qty 4

## 2023-02-19 MED ORDER — BUPIVACAINE LIPOSOME 1.3 % IJ SUSP
INTRAMUSCULAR | Status: AC
  Filled 2023-02-19: qty 20

## 2023-02-19 MED ORDER — PROPOFOL 10 MG/ML IV BOLUS
INTRAVENOUS | Status: AC
  Filled 2023-02-19: qty 20

## 2023-02-19 MED ORDER — ROCURONIUM BROMIDE 100 MG/10ML IV SOLN
INTRAVENOUS | Status: DC | PRN
  Administered 2023-02-19: 50 mg via INTRAVENOUS

## 2023-02-19 SURGICAL SUPPLY — 34 items
BAG COUNTER SPONGE SURGICOUNT (BAG) IMPLANT
BENZOIN TINCTURE PRP APPL 2/3 (GAUZE/BANDAGES/DRESSINGS) ×2 IMPLANT
BLADE SURG 15 STRL LF DISP TIS (BLADE) IMPLANT
BRIEF MESH DISP LRG (UNDERPADS AND DIAPERS) ×2 IMPLANT
CNTNR URN SCR LID CUP LEK RST (MISCELLANEOUS) ×2 IMPLANT
COVER SURGICAL LIGHT HANDLE (MISCELLANEOUS) ×2 IMPLANT
DRAPE LAPAROTOMY T 102X78X121 (DRAPES) ×2 IMPLANT
ELECT NDL BLADE 2-5/6 (NEEDLE) ×2 IMPLANT
ELECT NEEDLE BLADE 2-5/6 (NEEDLE) ×1
ELECT REM PT RETURN 15FT ADLT (MISCELLANEOUS) ×2 IMPLANT
GAUZE 4X4 16PLY ~~LOC~~+RFID DBL (SPONGE) ×2 IMPLANT
GAUZE PAD ABD 8X10 STRL (GAUZE/BANDAGES/DRESSINGS) IMPLANT
GAUZE SPONGE 4X4 12PLY STRL (GAUZE/BANDAGES/DRESSINGS) IMPLANT
GLOVE BIO SURGEON STRL SZ7.5 (GLOVE) ×2 IMPLANT
GLOVE INDICATOR 8.0 STRL GRN (GLOVE) ×2 IMPLANT
GOWN STRL REUS W/ TWL XL LVL3 (GOWN DISPOSABLE) ×4 IMPLANT
KIT BASIN OR (CUSTOM PROCEDURE TRAY) ×2 IMPLANT
KIT TURNOVER KIT A (KITS) IMPLANT
LOOP VESSEL MAXI BLUE (MISCELLANEOUS) IMPLANT
NDL HYPO 22X1.5 SAFETY MO (MISCELLANEOUS) ×2 IMPLANT
NEEDLE HYPO 22X1.5 SAFETY MO (MISCELLANEOUS) ×1
PACK BASIC VI WITH GOWN DISP (CUSTOM PROCEDURE TRAY) ×2 IMPLANT
PENCIL SMOKE EVACUATOR (MISCELLANEOUS) IMPLANT
SHEARS HARMONIC 9CM CVD (BLADE) IMPLANT
SPIKE FLUID TRANSFER (MISCELLANEOUS) ×2 IMPLANT
SURGILUBE 2OZ TUBE FLIPTOP (MISCELLANEOUS) ×2 IMPLANT
SUT CHROMIC 2 0 SH (SUTURE) ×2 IMPLANT
SUT CHROMIC 3 0 SH 27 (SUTURE) IMPLANT
SUT VIC AB 2-0 SH 27X BRD (SUTURE) IMPLANT
SUT VIC AB 2-0 UR6 27 (SUTURE) ×12 IMPLANT
SYR 20ML LL LF (SYRINGE) ×2 IMPLANT
SYR 3ML LL SCALE MARK (SYRINGE) IMPLANT
TOWEL OR 17X26 10 PK STRL BLUE (TOWEL DISPOSABLE) ×2 IMPLANT
TOWEL OR NON WOVEN STRL DISP B (DISPOSABLE) ×2 IMPLANT

## 2023-02-19 NOTE — Anesthesia Procedure Notes (Signed)
Procedure Name: Intubation Date/Time: 02/19/2023 3:07 PM  Performed by: Sharyn Dross, CRNAPre-anesthesia Checklist: Patient identified, Emergency Drugs available, Suction available and Patient being monitored Patient Re-evaluated:Patient Re-evaluated prior to induction Oxygen Delivery Method: Circle system utilized Preoxygenation: Pre-oxygenation with 100% oxygen Induction Type: IV induction Ventilation: Mask ventilation without difficulty Laryngoscope Size: Mac and 4 Grade View: Grade I Tube type: Oral Tube size: 7.5 mm Number of attempts: 1 Airway Equipment and Method: Stylet and Oral airway Placement Confirmation: ETT inserted through vocal cords under direct vision, positive ETCO2 and breath sounds checked- equal and bilateral Secured at: 22 cm Tube secured with: Tape Dental Injury: Teeth and Oropharynx as per pre-operative assessment

## 2023-02-19 NOTE — Discharge Instructions (Addendum)
ANORECTAL SURGERY: POST OP INSTRUCTIONS  DIET: Follow a light bland diet the first 24 hours after arrival home, such as soup, liquids, crackers, etc.  Be sure to include lots of fluids daily.  Avoid fast food or heavy meals as your are more likely to get nauseated.  Eat a low fat diet the next few days after surgery.   Some bleeding with bowel movements is expected for the first couple of days but this should stop in between bowel movements  Take your usually prescribed home medications unless otherwise directed. No foreign bodies per rectum for the next 3 months (enemas, etc)  PAIN CONTROL: It is helpful to take an over-the-counter pain medication regularly for the first few days/weeks.  Choose from the following that works best for you: Ibuprofen (Advil, etc) Three 200mg tabs every 6 hours as needed. Acetaminophen (Tylenol, etc) 500-650mg every 6 hours as needed NOTE: You may take both of these medications together - most patients find it most helpful when alternating between the two (i.e. Ibuprofen at 6am, tylenol at 9am, ibuprofen at 12pm ...) A  prescription for pain medication may have been prescribed for you at discharge.  Take your pain medication as prescribed.  If you are having problems/concerns with the prescription medicine, please call us for further advice.  Avoid getting constipated.  Between the surgery and the pain medications, it is common to experience some constipation.  Increasing fluid intake (64oz of water per day) and taking a fiber supplement (such as Metamucil, Citrucel, FiberCon) 1-2 times a day regularly will usually help prevent this problem from occurring.  Take Miralax (over the counter) 1-2x/day while taking a narcotic pain medication. If no bowel movement after 48hours, you may additionally take a laxative like a bottle of Milk of Magnesia which can be purchased over the counter. Avoid enemas.   Watch out for diarrhea.  If you have many loose bowel movements,  simplify your diet to bland foods.  Stop any stool softeners and decrease your fiber supplement. If this worsens or does not improve, please call us.  Wash / shower every day.  If you were discharged with a dressing, you may remove this the day after your surgery. You may shower normally, getting soap/water on your wound, particularly after bowel movements.  Soaking in a warm bath filled a couple inches ("Sitz bath") is a great way to clean the area after a bowel movement and many patients find it is a way to soothe the area.  ACTIVITIES as tolerated:   You may resume regular (light) daily activities beginning the next day--such as daily self-care, walking, climbing stairs--gradually increasing activities as tolerated.  If you can walk 30 minutes without difficulty, it is safe to try more intense activity such as jogging, treadmill, bicycling, low-impact aerobics, etc. Refrain from any heavy lifting or straining for the first 2 weeks after your procedure, particularly if your surgery was for hemorrhoids. Avoid activities that make your pain worse You may drive when you are no longer taking prescription pain medication, you can comfortably wear a seatbelt, and you can safely maneuver your car and apply brakes.  FOLLOW UP in our office Please call [facility] at [phone] to set up an appointment to see your surgeon in the office for a follow-up appointment approximately 2 weeks after your surgery. Make sure that you call for this appointment the day you arrive home to insure a convenient appointment time.  9. If you have disability or family leave forms   that need to be completed, you may have them completed by your primary care physician's office; for return to work instructions, please ask our office staff and they will be happy to assist you in obtaining this documentation   When to call us [phone]: Poor pain control Reactions / problems with new medications (rash/itching, etc)  Fever over  101.5 F (38.5 C) Inability to urinate Nausea/vomiting Worsening swelling or bruising Continued bleeding from incision. Increased pain, redness, or drainage from the incision  The clinic staff is available to answer your questions during regular business hours (8:30am-5pm).  Please don't hesitate to call and ask to speak to one of our nurses for clinical concerns.   A surgeon from [facility] is always on call at the hospitals   If you have a medical emergency, go to the nearest emergency room or call 911.   [facility] [address], [address], [place], [place]  [address] MAIN: [phone] FAX: [phone] [email or web address] 

## 2023-02-19 NOTE — Progress Notes (Signed)
MDA at bedside.  Acknowledges DBP 120.  MDA ok with patient going to the Phase II area.  Patient educated on the importance of not missing doses of his BP medication.  Phase II RN Alcario Drought given report.

## 2023-02-19 NOTE — Anesthesia Postprocedure Evaluation (Signed)
Anesthesia Post Note  Patient: Nicholas Richmond  Procedure(s) Performed: HEMORRHOIDECTOMY 2 VS 3 COLUMN ANORECTAL EXAM UNDER ANESTHESIA     Patient location during evaluation: PACU Anesthesia Type: General Level of consciousness: awake and alert Pain management: pain level controlled Vital Signs Assessment: post-procedure vital signs reviewed and stable Respiratory status: spontaneous breathing, nonlabored ventilation, respiratory function stable and patient connected to nasal cannula oxygen Cardiovascular status: blood pressure returned to baseline and stable Postop Assessment: no apparent nausea or vomiting Anesthetic complications: no   No notable events documented.  Last Vitals:  Vitals:   02/19/23 1645 02/19/23 1705  BP: (!) 163/110 (!) 172/116  Pulse: 81 75  Resp: 11 12  Temp: 36.9 C 36.8 C  SpO2: 100% 99%    Last Pain:  Vitals:   02/19/23 1705  TempSrc:   PainSc: 0-No pain   Pain Goal:                   Trevor Iha

## 2023-02-19 NOTE — Progress Notes (Signed)
Attempted to call MDA to let him know patients BP remains 159/120.  No answer will attempt again before moving the patient to phase II area.

## 2023-02-19 NOTE — H&P (Signed)
CC: Here today for surgery  HPI: Nicholas Richmond is an 62 y.o. male with history of HTN, whom is seen in the office today as a referral by Dr. Adela Lank for evaluation of possible hemorrhoids.   Colonoscopy with Dr. Adela Lank 03/25/2018: - Hemorrhoids on perianal exam - 3 mm polyp in transverse colon removed. - Diverticulosis in transverse - Internal hemorrhoids.  Flexible sigmoidoscopy 12/06/2022 with Dr. Adela Lank: - Grade 3 prolapsed hemorrhoids, reduced - Rectum sigmoid and rectosigmoid colon are normal - Internal hemorrhoids. 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 He will often times have to reduce it. He reports that he previously had smoked crack and would spend excessive amounts of time on the commode and would even strain at times. Following all this he developed a lot of these issues. He says that for years now he has no longer use any illicit drugs.  He reports he has 1 bowel movement per day and is generally soft. He has been taking Metamucil daily for at least the last 2 to 3 months. He drinks 4-5 bottles of water per day. He reports that he spends approximately 3 to 5 minutes on the commode with a bowel movement.  PMH: HTN  PSH: He denies ever having had any other prior surgical procedures or anorectal surgery.  FHx: Denies any known family history of colorectal, breast, endometrial or ovarian cancer  Social Hx: Denies use of tobacco/EtOH/illicit drug. He currently works in shipping/receiving for [employer]   He denies any changes in health or health history since we met in the office. No new medications/allergies. He states he is ready for surgery today.  Past Medical History:  Diagnosis Date   Arthritis    Asthma    Fatty liver    Hemorrhoids    Hypertension    Pre-diabetes    Substance abuse (HCC)    current marijuana use and   Remote cocaine history    Past Surgical History:  Procedure Laterality Date   COLONOSCOPY     NO PAST SURGERIES      Family History  Problem Relation Age of Onset   Colon cancer Neg Hx    Esophageal cancer Neg Hx    Liver disease Neg Hx    Rectal cancer Neg Hx    Stomach cancer Neg Hx     Social:  reports that he has never smoked. He has never used smokeless tobacco. He reports current alcohol use of about 3.0 standard drinks of alcohol per week. He reports current drug use. Drug: Marijuana.  Allergies: No Known Allergies  Medications: I have reviewed the patient's current medications.  No results found for this or any previous visit (from the past 48 hours).  No results found.   PE Blood pressure (!) 138/98, pulse 60, temperature 98.3 F (36.8 C), temperature source Oral, resp. rate 16, height 5\' 11"  (1.803 m), weight 77.4 kg, SpO2 100%. Constitutional: NAD; conversant Eyes: Moist conjunctiva; no lid lag; anicteric Lungs: Normal respiratory effort CV: RRR Psychiatric: Appropriate affect  No results found for this or any previous visit (from the past 48 hours).  No results found.  A/P: Nicholas Richmond is an 62 y.o. male with hx of HTN here for evaluation of 3 column internal hemorrhoidal prolapse  -The degree of mucosal prolapse he has associated with his hemorrhoidal columns as similar to a partial thickness rectal prolapse  but they are distinct and that there are 3 columns of prolapse as opposed to any sort of full-thickness concentric type prolapse. Therefore, I think it would be reasonable to start with treating these as advanced hemorrhoids.  -He has already been attempting medical management with daily fiber supplementation using Metamucil, drinking at least 4-5 bottles of water per day and working to minimize his commode times. Despite this, he notes no improvement whatsoever in his symptoms.  -The anatomy and physiology of the anal canal was discussed with the  patient with associated pictures. The pathophysiology of hemorrhoids was discussed at length with associated pictures and illustrations-the hemorrhoid book, publisher [phone] pages -We have reviewed options going forward including further observation vs surgery -3, hemorrhoidectomy; anorectal exam under anesthesia. -The planned procedure, material risks (including, but not limited to, pain, bleeding, infection, scarring, need for blood transfusion, damage to anal sphincter, incontinence of gas and/or stool, need for additional procedures, anal stenosis, rare cases of pelvic sepsis which in severe cases may require things like a colostomy, recurrence, pneumonia, heart attack, stroke, death) benefits and alternatives to surgery were discussed at length. I noted a good probability that the procedure would help improve their symptoms. The patient's questions were answered to his satisfaction, he voiced understanding and elected to proceed with surgery. Additionally, we discussed typical postoperative expectations and the recovery process.   Marin Olp, MD [facility]

## 2023-02-19 NOTE — Op Note (Signed)
02/19/2023  3:59 PM  PATIENT:  Nicholas Richmond  62 y.o. male  Patient Care Team: Allwardt, Crist Infante, PA-C as PCP - General (Physician Assistant)  PRE-OPERATIVE DIAGNOSIS: Hemorrhoidal prolapse, internal  POST-OPERATIVE DIAGNOSIS: Same  PROCEDURE:   1.  Hemorrhoidectomy, 3 column 2.  Anorectal exam under anesthesia  SURGEON:  Surgeon(s): Andria Meuse, MD  ASSISTANT: OR Staff   ANESTHESIA:   local and general  SPECIMEN:   Left posterior hemorrhoidal tissue Right anterior hemorrhoidal tissue Right posterior hemorrhoidal tissue  DISPOSITION OF SPECIMEN:  PATHOLOGY  COUNTS:  Sponge, needle, and instrument counts were reported correct x2 at conclusion.  EBL: 30 mL  Drains: None  PLAN OF CARE: Discharge to home after PACU  PATIENT DISPOSITION:  PACU - hemodynamically stable.  OR FINDINGS: 3 column internal hemorrhoidal prolapse.  No other notable findings on circumferential anoscopy.  DESCRIPTION: The patient was identified in the preoperative holding area and taken to the OR. SCDs were applied.  He then underwent general endotracheal anesthesia without difficulty. The patient was then rolled onto the OR table in the prone jackknife position. Pressure points were then evaluated and padded. Benzoin was applied to the buttocks and they were gently taped apart.  The was then prepped and draped in usual sterile fashion.  A surgical timeout was performed indicating the correct patient, procedure, and positioning.  A perianal block was then created using a dilute mixture of 0.25% Marcaine with epinephrine and Exparel.  After ascertaining an appropriate level of anesthesia had been achieved, a well lubricated digital rectal exam was performed. This demonstrated no palpable masses or other abnormalities.  A Hill-Ferguson anoscope was into the anal canal and circumferential inspection demonstrated healthy appearing anoderm.  He does have grade 3/4 hemorrhoidal prolapse and 3  distinct columns.  The overlying mucosa is otherwise normal in appearance.  Attention is first directed at the largest hemorrhoidal bundle which is in the left posterior position.  With the anoscope in place, the internal hemorrhoidal tissue was elevated with a DeBakey forcep.  The anoderm was then incised.  The underlying sphincter muscle was dissected free.  The hemorrhoidal column was then ligated and divided using the hand-held LigaSure device and no sphincter muscle is divided.  The hemorrhoidal tissue was passed off as specimen.  Some hemorrhoidal varicosities are fulgurated.  Hemostasis is then achieved electrocautery.  The resultant hemorrhoidal defect was then closed using a running partially locking 2-0 Vicryl suture.  The area was then irrigated and hemostasis verified.  Attention is then directed at the right anterior hemorrhoidal tissue. With the anoscope in place, the internal hemorrhoidal tissue was elevated with a DeBakey forcep.  The anoderm was then incised.  The underlying sphincter muscle was dissected free.  The hemorrhoidal column was then ligated and divided using the hand-held LigaSure device. No sphincter muscle is divided.   The hemorrhoidal tissue was passed off as specimen.  Some hemorrhoidal varicosities are fulgurated.  Hemostasis is then achieved electrocautery.  The resultant hemorrhoidal defect was then closed using a running partially locking 2-0 Vicryl suture.  The area was then irrigated and hemostasis verified.  Attention is then directed at the right posterior hemorrhoidal tissue. With the anoscope in place, the internal hemorrhoidal tissue was elevated with a DeBakey forcep.  The anoderm was then incised.  The underlying sphincter muscle was dissected free.  The hemorrhoidal column was then ligated and divided using the hand-held LigaSure device. No sphincter muscle is divided. The hemorrhoidal tissue was passed off  as specimen.  Some hemorrhoidal varicosities are  fulgurated.  Hemostasis is then achieved electrocautery.  The resultant hemorrhoidal defect was then closed using a running partially locking 2-0 Vicryl suture.  The area was then irrigated and hemostasis verified.  The anal canal is then reinspected.  Hemostasis is verified.  Additional local anesthetic is infiltrated at the hemorrhoidectomy sites.  There is no anal stenosis.  All sponge, needle, and instrument counts were reported correct.  A piece of Surgifoam coated to be cane is then applied within the anal canal.  The buttocks are untaped.  A dressing consisting of 4 x 4's, ABD, mesh underwear is ultimately placed.  He was rolled back onto a stretcher, awakened from anesthesia, extubated, and transported to recovery in satisfactory condition.  DISPOSITION: PACU in satisfactory condition.

## 2023-02-19 NOTE — Transfer of Care (Signed)
Immediate Anesthesia Transfer of Care Note  Patient: Nicholas Richmond  Procedure(s) Performed: HEMORRHOIDECTOMY 2 VS 3 COLUMN ANORECTAL EXAM UNDER ANESTHESIA  Patient Location: PACU  Anesthesia Type:General  Level of Consciousness: awake, alert , and oriented  Airway & Oxygen Therapy: Patient Spontanous Breathing  Post-op Assessment: Report given to RN and Post -op Vital signs reviewed and stable  Post vital signs: Reviewed and stable  Last Vitals:  Vitals Value Taken Time  BP 196/125 02/19/23 1615  Temp 36.9 C 02/19/23 1612  Pulse 92 02/19/23 1617  Resp 21 02/19/23 1617  SpO2 95 % 02/19/23 1617  Vitals shown include unfiled device data.  Last Pain:  Vitals:   02/19/23 1050  TempSrc: Oral  PainSc: 0-No pain         Complications: No notable events documented.

## 2023-02-20 ENCOUNTER — Encounter (HOSPITAL_COMMUNITY): Payer: Self-pay | Admitting: Surgery

## 2023-02-21 LAB — SURGICAL PATHOLOGY

## 2023-02-22 ENCOUNTER — Ambulatory Visit: Payer: Self-pay | Admitting: Physician Assistant

## 2023-02-22 ENCOUNTER — Other Ambulatory Visit: Payer: Self-pay

## 2023-02-22 ENCOUNTER — Emergency Department (HOSPITAL_COMMUNITY)
Admission: EM | Admit: 2023-02-22 | Discharge: 2023-02-22 | Disposition: A | Discharge status: Home / Self Care | Payer: Managed Care, Other (non HMO) | Attending: Emergency Medicine | Admitting: Emergency Medicine

## 2023-02-22 ENCOUNTER — Encounter (HOSPITAL_COMMUNITY): Payer: Self-pay

## 2023-02-22 DIAGNOSIS — R112 Nausea with vomiting, unspecified: Secondary | ICD-10-CM | POA: Insufficient documentation

## 2023-02-22 DIAGNOSIS — I10 Essential (primary) hypertension: Secondary | ICD-10-CM | POA: Diagnosis not present

## 2023-02-22 DIAGNOSIS — Z79899 Other long term (current) drug therapy: Secondary | ICD-10-CM | POA: Insufficient documentation

## 2023-02-22 DIAGNOSIS — J45909 Unspecified asthma, uncomplicated: Secondary | ICD-10-CM | POA: Diagnosis not present

## 2023-02-22 DIAGNOSIS — E860 Dehydration: Secondary | ICD-10-CM | POA: Insufficient documentation

## 2023-02-22 DIAGNOSIS — R000 Tachycardia, unspecified: Secondary | ICD-10-CM | POA: Insufficient documentation

## 2023-02-22 LAB — COMPREHENSIVE METABOLIC PANEL
ALT: 37 U/L (ref 0–44)
AST: 39 U/L (ref 15–41)
Albumin: 4.5 g/dL (ref 3.5–5.0)
Alkaline Phosphatase: 50 U/L (ref 38–126)
Anion gap: 12 (ref 5–15)
BUN: 33 mg/dL — ABNORMAL HIGH (ref 6–20)
CO2: 22 mmol/L (ref 22–32)
Calcium: 10.1 mg/dL (ref 8.9–10.3)
Chloride: 103 mmol/L (ref 98–111)
Creatinine, Ser: 1.3 mg/dL — ABNORMAL HIGH (ref 0.61–1.24)
GFR, Estimated: >60 mL/min (ref >60)
Glucose, Bld: 141 mg/dL — ABNORMAL HIGH (ref 70–99)
Potassium: 3.5 mmol/L (ref 3.5–5.1)
Sodium: 137 mmol/L (ref 135–145)
Total Bilirubin: 2.3 mg/dL — ABNORMAL HIGH (ref <1.2)
Total Protein: 8.7 g/dL — ABNORMAL HIGH (ref 6.5–8.1)

## 2023-02-22 LAB — URINALYSIS, ROUTINE W REFLEX MICROSCOPIC
Bilirubin Urine: NEGATIVE
Glucose, UA: NEGATIVE mg/dL
Ketones, ur: NEGATIVE mg/dL
Leukocytes,Ua: NEGATIVE
Nitrite: NEGATIVE
Protein, ur: >=300 mg/dL — AB
Specific Gravity, Urine: 1.029 (ref 1.005–1.030)
pH: 5 (ref 5.0–8.0)

## 2023-02-22 LAB — CBC WITH DIFFERENTIAL/PLATELET
Abs Immature Granulocytes: 0.05 10*3/uL (ref 0.00–0.07)
Basophils Absolute: 0 10*3/uL (ref 0.0–0.1)
Basophils Relative: 0 %
Eosinophils Absolute: 0 10*3/uL (ref 0.0–0.5)
Eosinophils Relative: 0 %
HCT: 51.5 % (ref 39.0–52.0)
Hemoglobin: 18.5 g/dL — ABNORMAL HIGH (ref 13.0–17.0)
Immature Granulocytes: 0 %
Lymphocytes Relative: 11 %
Lymphs Abs: 1.5 10*3/uL (ref 0.7–4.0)
MCH: 34.8 pg — ABNORMAL HIGH (ref 26.0–34.0)
MCHC: 35.9 g/dL (ref 30.0–36.0)
MCV: 96.8 fL (ref 80.0–100.0)
Monocytes Absolute: 1.5 10*3/uL — ABNORMAL HIGH (ref 0.1–1.0)
Monocytes Relative: 11 %
Neutro Abs: 10.6 10*3/uL — ABNORMAL HIGH (ref 1.7–7.7)
Neutrophils Relative %: 78 %
Platelets: 357 10*3/uL (ref 150–400)
RBC: 5.32 MIL/uL (ref 4.22–5.81)
RDW: 13.1 % (ref 11.5–15.5)
WBC: 13.6 10*3/uL — ABNORMAL HIGH (ref 4.0–10.5)
nRBC: 0 % (ref 0.0–0.2)

## 2023-02-22 LAB — LIPASE, BLOOD: Lipase: 50 U/L (ref 11–51)

## 2023-02-22 MED ORDER — ONDANSETRON HCL 4 MG/2ML IJ SOLN
4.0000 mg | Freq: Once | INTRAMUSCULAR | Status: AC
  Administered 2023-02-22: 4 mg via INTRAVENOUS
  Filled 2023-02-22: qty 2

## 2023-02-22 MED ORDER — ONDANSETRON HCL 4 MG PO TABS: 4.0000 mg | ORAL_TABLET | Freq: Three times a day (TID) | ORAL | 0 refills | Status: DC | PRN

## 2023-02-22 MED ORDER — SODIUM CHLORIDE 0.9 % IV BOLUS
1500.0000 mL | Freq: Once | INTRAVENOUS | Status: AC
  Administered 2023-02-22: 1500 mL via INTRAVENOUS

## 2023-02-22 MED ORDER — ONDANSETRON 8 MG PO TBDP: 8.0000 mg | ORAL_TABLET | Freq: Once | ORAL | Status: DC

## 2023-02-22 NOTE — Telephone Encounter (Unsigned)
Copied from CRM [id number]. Topic: Clinical - Medical Advice >> Feb 22, 2023  8:14 AM Josefa Half C wrote: Reason for CRM: Patient

## 2023-02-22 NOTE — ED Triage Notes (Addendum)
Patient had hemorrhoid surgery Monday. Has not stopped vomiting since. Denies abdominal pain. No diarrhea. No blood in vomit. Patient said this has happened before and he is dehydrated.

## 2023-02-22 NOTE — ED Provider Triage Note (Signed)
Emergency Medicine Provider Triage Evaluation Note  Claudis Matthes , a 62 y.o. male  was evaluated in triage.  Pt complains of postop emesis.  Darted 2 days ago.  Patient had hemorrhoidectomy on December 16.  Has been having normal bowel movements and no rectal bleeding since the surgery.  But states he has been persistently vomiting.  Denies abdominal pain.  Review of Systems  Positive: See above Negative: See above  Physical Exam  BP (!) 160/124 (BP Location: Right Arm)   Pulse (!) 115   Temp 98.8 F (37.1 C) (Oral)   Resp 18   Ht 5\' 11"  (1.803 m)   Wt 77.1 kg   SpO2 100%   BMI 23.71 kg/m  Gen:   Awake, no distress   Resp:  Normal effort  MSK:   Moves extremities without difficulty  Other:    Medical Decision Making  Medically screening exam initiated at 10:02 AM.  Appropriate orders placed.  Elwin Hollands was informed that the remainder of the evaluation will be completed by another provider, this initial triage assessment does not replace that evaluation, and the importance of remaining in the ED until their evaluation is complete.  Work up started   Gareth Eagle, PA-C 02/22/23 1003

## 2023-02-22 NOTE — ED Provider Notes (Signed)
[place] EMERGENCY DEPARTMENT AT [facility] Provider Note  CSN: [id number] Arrival date & time: 02/22/23 1308  Chief Complaint(s) Post Op Emesis  HPI Nicholas Richmond is a 62 y.o. male with history of hemorrhoids, prediabetes presenting to the emergency department with nausea and vomiting.  He had a hemorrhoid surgery around 5 days ago.  He reports since then he has had persistent nausea and vomiting.  He reports otherwise he has felt well.  He denies any abdominal pain, fevers, trouble with defecation, hematemesis, chest pain, shortness of breath, back pain, headaches.  He reports he has some chills when he has vomiting but otherwise is okay.  Also reports some dark urine.  He reports that he has had similar episodes before where he just cannot stay hydrated and has persistent vomiting.   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 Authorizing Provider  ondansetron (ZOFRAN) 4 MG tablet Take 1 tablet (4 mg total) by mouth every 8 (eight) hours as needed for nausea or vomiting. 02/22/23  Yes Lonell Grandchild, MD  amLODipine (NORVASC) 10 MG tablet Take 10 mg by mouth daily.    [provider]  hydrochlorothiazide (HYDRODIURIL) 12.5 MG tablet Take 1 tablet by mouth once daily 02/13/23   Allwardt, Alyssa M, PA-C  Multiple Vitamins-Minerals (MULTIVITAMIN WITH MINERALS) tablet Take 1 tablet by mouth daily. One-A-Day Men's 50+     [provider]  promethazine (PHENERGAN) 25 MG suppository Place 1 suppository (25 mg total) rectally every 6 (six) hours as needed for nausea or vomiting. Patient not taking: Reported on 02/14/2023 11/29/22   Lula Olszewski, MD  traMADol (ULTRAM) 50 MG tablet Take 1 tablet (50 mg total) by mouth every 6 (six) hours as needed for up to 5 days (postop pain not controlled with tylenol and ibuprofen first). 02/19/23 02/24/23  Andria Meuse, MD                                                                                                                                    Past Surgical History Past Surgical History:  Procedure Laterality Date   COLONOSCOPY     HEMORRHOID SURGERY N/A 02/19/2023   Procedure: HEMORRHOIDECTOMY 2 VS 3 COLUMN;  Surgeon: Andria Meuse, MD;  Location: [facility] ORS;  Service: General;  Laterality:  N/A;   NO PAST SURGERIES     RECTAL EXAM UNDER ANESTHESIA N/A 02/19/2023   Procedure: ANORECTAL EXAM UNDER ANESTHESIA;  Surgeon: Andria Meuse, MD;  Location: [facility] ORS;  Service: General;  Laterality: N/A;   Family History Family History  Problem Relation Age of Onset   Colon cancer Neg Hx    Esophageal cancer Neg Hx    Liver disease Neg Hx    Rectal cancer Neg Hx    Stomach cancer Neg Hx     Social History Social History   Tobacco Use   Smoking status: Never   Smokeless tobacco: Never  Vaping Use   Vaping status: Never Used  Substance Use Topics   Alcohol use: Yes    Alcohol/week: 3.0 standard drinks of alcohol    Types: 3 Cans of beer per week    Comment: occasional   Drug use: Yes    Types: Marijuana    Comment: last used more than week ago 12-06-22   Allergies Patient has no known allergies.  Review of Systems Review of Systems  All other systems reviewed and are negative.   Physical Exam Vital Signs  I have reviewed the triage vital signs BP (!) 168/102   Pulse 84   Temp 98.5 F (36.9 C)   Resp 10   Ht 5\' 11"  (1.803  m)   Wt 77.1 kg   SpO2 98%   BMI 23.71 kg/m  Physical Exam Vitals and nursing note reviewed.  Constitutional:      General: He is not in acute distress.    Appearance: Normal appearance.  HENT:     Mouth/Throat:     Mouth: Mucous membranes are dry.  Eyes:     Conjunctiva/sclera: Conjunctivae normal.  Cardiovascular:     Rate and Rhythm: Regular rhythm. Tachycardia present.  Pulmonary:     Effort: Pulmonary effort is normal. No respiratory distress.     Breath sounds: Normal breath sounds.  Abdominal:     General: Abdomen is flat.     Palpations: Abdomen is soft.     Tenderness: There is no abdominal tenderness.  Genitourinary:    Comments: Chaperoned by paramedic, postsurgical change from hemorrhoidectomy without abscess, erythema, wounds  Musculoskeletal:     Right lower leg: No edema.     Left lower leg: No edema.  Skin:    General: Skin is warm and dry.     Capillary Refill: Capillary refill takes less than 2 seconds.  Neurological:     Mental Status: He is alert and oriented to person, place, and time. Mental status is at baseline.  Psychiatric:        Mood and Affect: Mood normal.        Behavior: Behavior normal.     ED Results and Treatments Labs (all labs ordered are listed, but only abnormal results are displayed) Labs Reviewed  CBC WITH DIFFERENTIAL/PLATELET - Abnormal; Notable for the following components:      Result Value   WBC 13.6 (*)    Hemoglobin 18.5 (*)    MCH 34.8 (*)    Neutro Abs 10.6 (*)    Monocytes Absolute 1.5 (*)    All other components within normal limits  COMPREHENSIVE METABOLIC PANEL - Abnormal; Notable for the following components:   Glucose, Bld 141 (*)    BUN 33 (*)    Creatinine, Ser 1.30 (*)    Total Protein 8.7 (*)    Total Bilirubin 2.3 (*)    All  other components within normal limits  URINALYSIS, ROUTINE W REFLEX MICROSCOPIC - Abnormal; Notable for the following components:   Color, Urine AMBER (*)    Hgb urine  dipstick MODERATE (*)    Protein, ur >=300 (*)    Bacteria, UA RARE (*)    All other components within normal limits  LIPASE, BLOOD                                                                                                                          Radiology No results found.  Pertinent labs & imaging results that were available during my care of the patient were reviewed by me and considered in my medical decision making (see MDM for details).  Medications Ordered in ED Medications  sodium chloride 0.9 % bolus 1,500 mL (0 mLs Intravenous Stopped 02/22/23 1814)  ondansetron (ZOFRAN) injection 4 mg (4 mg Intravenous Given 02/22/23 1542)                                                                                                                                     Procedures Procedures  (including critical care time)  Medical Decision Making / ED Course   MDM:  62 year old male presenting to the emergency department with nausea and vomiting.  Patient overall well-appearing, vitals with mild tachycardia.  Unclear cause of nausea and vomiting, given began after surgery, could represent reaction to anesthesia.  He has not had any antiemetics to take at home.  Will give fluids, Zofran.  His labs are concerning for mild dehydration with mild AKI and mild hemoconcentration.  Low concern for dangerous cause of vomiting.  He has no headaches or altered mental status to suggest intracranial pathology, denies history of head trauma.  No abdominal tenderness, abdominal pain to suggest any intra-abdominal process such as obstruction or perforation, volvulus.  Rectal exam with postsurgical change but no abscess or signs of infection.  He reports chills but no fevers, no fever in the emergency department.  Lungs clear without signs or symptoms of pneumonia.  He does not have any history of diabetes and labs reassuring with no anion gap to suggest DKA or other metabolic abnormality.  Despite  his dehydration he looks very well-appearing.  If he is feeling better after fluids, anticipate likely discharge with antiemetics to take at home.  Clinical Course as of 02/22/23 1815  Thu Feb 22, 2023  1814 Patient is feeling better.  Heart rate has improved.  He has been able to tolerate p.o. without difficulty.  He request discharge.  Heart rate currently on the monitor in the low 90s.  Low concern for any underlying dangerous process.  Will prescribe Zofran, advise follow-up with his PMD and surgeon.  Advise recheck of labs given mild AKI. Will discharge patient to home. All questions answered. Patient comfortable with plan of discharge. Return precautions discussed with patient and specified on the after visit summary.  [WS]    Clinical Course User Index [WS] Suezanne Jacquet, Jerilee Field, MD     Additional history obtained: -Additional history obtained from spouse -External records from outside source obtained and reviewed including: Chart review including previous notes, labs, imaging, consultation notes including prior op note from recent hemorrhoid surgery   Lab Tests: -I ordered, reviewed, and interpreted labs.   The pertinent results include:   Labs Reviewed  CBC WITH DIFFERENTIAL/PLATELET - Abnormal; Notable for the following components:      Result Value   WBC 13.6 (*)    Hemoglobin 18.5 (*)    MCH 34.8 (*)    Neutro Abs 10.6 (*)    Monocytes Absolute 1.5 (*)    All other components within normal limits  COMPREHENSIVE METABOLIC PANEL - Abnormal; Notable for the following components:   Glucose, Bld 141 (*)    BUN 33 (*)    Creatinine, Ser 1.30 (*)    Total Protein 8.7 (*)    Total Bilirubin 2.3 (*)    All other components within normal limits  URINALYSIS, ROUTINE W REFLEX MICROSCOPIC - Abnormal; Notable for the following components:   Color, Urine AMBER (*)    Hgb urine dipstick MODERATE (*)    Protein, ur >=300 (*)    Bacteria, UA RARE (*)    All other components within  normal limits  LIPASE, BLOOD    Notable for mild AKI   EKG   EKG Interpretation Date/Time:  Thursday February 22 2023 15:45:40 EST Ventricular Rate:  89 PR Interval:  123 QRS Duration:  85 QT Interval:  354 QTC Calculation: 431 R Axis:   50  Text Interpretation: Sinus rhythm Confirmed by Alvino Blood ([id number]) on 02/22/2023 4:12:58 PM        Medicines ordered and prescription drug management: Meds ordered this encounter  Medications   DISCONTD: ondansetron (ZOFRAN-ODT) disintegrating tablet 8 mg   sodium chloride 0.9 % bolus 1,500 mL   ondansetron (ZOFRAN) injection 4 mg   ondansetron (ZOFRAN) 4 MG tablet    Sig: Take 1 tablet (4 mg total) by mouth every 8 (eight) hours as needed for nausea or vomiting.    Dispense:  12 tablet    Refill:  0    -I have reviewed the patients home medicines and have made adjustments as needed  Cardiac Monitoring: The patient was maintained on a cardiac monitor.  I personally viewed and interpreted the cardiac monitored which showed an underlying rhythm of: sinus tachycardia   Reevaluation: After the interventions noted above, I reevaluated the patient and found that their symptoms have improved  Co morbidities that complicate the patient evaluation  Past Medical History:  Diagnosis Date   Arthritis    Asthma    Fatty liver    Hemorrhoids    Hypertension    Pre-diabetes    Substance abuse (HCC)    current marijuana use and  Remote cocaine history      Dispostion: Disposition decision including need  for hospitalization was considered, and patient discharged from emergency department.    Final Clinical Impression(s) / ED Diagnoses Final diagnoses:  Dehydration  Nausea and vomiting, unspecified vomiting type     This chart was dictated using voice recognition software.  Despite best efforts to proofread,  errors can occur which can change the documentation meaning.    Lonell Grandchild, MD 02/22/23 [phone]

## 2023-02-22 NOTE — Discharge Instructions (Signed)
We evaluated you for your nausea and vomiting.  Your laboratory testing was reassuring and your symptoms improved with IV fluids and nausea medication.  Please follow-up with your surgeon and your primary doctor.  Please return if you have any recurrent symptoms.  We have prescribed you a nausea medication which you can take every 8 hours as needed for nausea and vomiting.

## 2023-02-22 NOTE — Telephone Encounter (Signed)
Copied from CRM [id number]. Topic: Clinical - Medical Advice >> Feb 22, 2023  8:17 AM Josefa Half C wrote: Reason for CRM: Patient just had surgey, throwing up, not having bowel movement since monday . Trans to nurse triage for assistance    Chief Complaint: Post op vomiting Symptoms: vomiting Frequency: 3-4x a day Pertinent Negatives: Patient denies pain Disposition: [x] ED /[] Urgent Care (no appt availability in office) / [] Appointment(In office/virtual)/ []  [facility]/ [] Home Care/ [] Refused Recommended Disposition /[] [facility]/ []  Follow-up with PCP Additional Notes: patient is s/p hemorrhoidecomy 02/16/23. Sts that he has been vomiting persistently since 12/14. Pt denies pain, but reports cold chills. Pt feels he also may be dehydrated due to the vomiting. RN advised pt to go to ED since next avail PCP appt is mid-[name]. Pt is agreeable and will get wife to  take him.       Reason for Disposition  [1] Vomiting AND [2] persists > 4 hours  Answer Assessment - Initial Assessment Questions 1. SYMPTOM: "What's the main symptom you're concerned about?" (e.g., pain, fever, vomiting)     Vomiting everyday since the surgery, has been hot and cold  2. ONSET: "When did the vomiting  start?"     Tuesday, the day after the surgery  3. SURGERY: "What surgery did you have?"     Hemorroidectomy  4. DATE of SURGERY: "When was the surgery?"      02/19/23  5. ANESTHESIA: " What type of anesthesia did you have?" (e.g., general, spinal, epidural, local)     Unsure of type of anesthesia  6. PAIN: "Is there any pain?" If Yes, ask: "How bad is it?"  (Scale 1-10; or mild, moderate, severe)     No pain at this moment  7. FEVER: "Do you have a fever?" If Yes, ask: "What is your temperature, how was it measured, and when did it start?"     Unsure of fever  8. VOMITING: "Is there any vomiting?" If Yes, ask: "How many times?"     3-4 times since Tuesday  9. BLEEDING: "Is there  any bleeding?" If Yes, ask: "How much?" and "Where?"     Normal bleeding that was to be expected  10. OTHER SYMPTOMS: "Do you have any other symptoms?" (e.g., drainage from wound, painful urination, constipation)       No other symptoms  Protocols used: Post-Op Symptoms and Questions-A-AH

## 2023-03-06 ENCOUNTER — Telehealth: Payer: Self-pay

## 2023-03-06 NOTE — Progress Notes (Signed)
 Transition Care Management Unsuccessful Follow-up Telephone Call  Date of discharge and from where:  02/22/2023 [facility]  Attempts:  1st Attempt  Reason for unsuccessful TCM follow-up call:  No answer/busy  Baani Bober [facility]  [facility], [facility] Resource Care Guide Direct Dial: [phone]  Website: [email or web address]

## 2023-03-06 NOTE — Progress Notes (Signed)
 Transition Care Management Unsuccessful Follow-up Telephone Call  Date of discharge and from where:  02/22/2023 [facility]  Attempts:  2nd Attempt  Reason for unsuccessful TCM follow-up call:  Left voice message  Istvan Behar [facility]  [facility], [facility] Resource Care Guide Direct Dial: [phone]  Website: [email or web address]

## 2023-03-13 ENCOUNTER — Encounter: Payer: Managed Care, Other (non HMO) | Admitting: Physician Assistant

## 2023-03-14 ENCOUNTER — Other Ambulatory Visit: Payer: Self-pay | Admitting: Physician Assistant

## 2023-03-14 DIAGNOSIS — I10 Essential (primary) hypertension: Secondary | ICD-10-CM

## 2023-04-10 ENCOUNTER — Other Ambulatory Visit: Payer: Self-pay | Admitting: Physician Assistant

## 2023-04-10 DIAGNOSIS — I10 Essential (primary) hypertension: Secondary | ICD-10-CM

## 2023-04-13 ENCOUNTER — Ambulatory Visit (HOSPITAL_COMMUNITY)
Admission: EM | Admit: 2023-04-13 | Discharge: 2023-04-13 | Disposition: A | Discharge status: Home / Self Care | Payer: Managed Care, Other (non HMO) | Attending: Emergency Medicine | Admitting: Emergency Medicine

## 2023-04-13 ENCOUNTER — Encounter (HOSPITAL_COMMUNITY): Payer: Self-pay

## 2023-04-13 DIAGNOSIS — J029 Acute pharyngitis, unspecified: Secondary | ICD-10-CM | POA: Diagnosis not present

## 2023-04-13 DIAGNOSIS — R197 Diarrhea, unspecified: Secondary | ICD-10-CM

## 2023-04-13 DIAGNOSIS — B349 Viral infection, unspecified: Secondary | ICD-10-CM

## 2023-04-13 MED ORDER — BENZONATATE 100 MG PO CAPS: 100.0000 mg | ORAL_CAPSULE | Freq: Three times a day (TID) | ORAL | 0 refills | Status: DC

## 2023-04-13 MED ORDER — ONDANSETRON 4 MG PO TBDP: 4.0000 mg | ORAL_TABLET | Freq: Three times a day (TID) | ORAL | 0 refills | Status: DC | PRN

## 2023-04-13 MED ORDER — ONDANSETRON 4 MG PO TBDP
ORAL_TABLET | ORAL | Status: AC
Start: 2023-04-13 — End: ?
  Filled 2023-04-13: qty 1

## 2023-04-13 MED ORDER — ONDANSETRON 4 MG PO TBDP
4.0000 mg | ORAL_TABLET | Freq: Once | ORAL | Status: AC
  Administered 2023-04-13: 4 mg via ORAL

## 2023-04-13 NOTE — ED Provider Notes (Signed)
 [facility]    CSN: [id number] Arrival date & time: 04/13/23  0801      History   Chief Complaint Chief Complaint  Patient presents with   Sore Throat    HPI Nicholas Richmond is a 63 y.o. male.   Patient presents to clinic complaining of fatigue, sore throat, nausea and diarrhea since Sunday.  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  Today's had 2 episodes of diarrhea.  Overall oral intake has been diminished.  No abdominal pain.  Has not had any fevers.  Does have a mild nonproductive cough that is intermittent.  No chest pain or shortness of breath.  The history is provided by the patient and medical records.  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   Prediabetes 12/24/2017   Essential hypertension 11/13/2017   Lipoma of back 11/13/2017    Past Surgical History:  Procedure Laterality Date   COLONOSCOPY     HEMORRHOID SURGERY N/A 02/19/2023   Procedure: HEMORRHOIDECTOMY 2 VS 3 COLUMN;  Surgeon: Teresa Lonni HERO, MD;  Location: [facility];  Service: General;  Laterality: N/A;   NO PAST SURGERIES     RECTAL EXAM UNDER ANESTHESIA N/A 02/19/2023   Procedure: ANORECTAL EXAM UNDER ANESTHESIA;  Surgeon: Teresa Lonni HERO, MD;  Location: [facility];  Service: General;  Laterality: N/A;       Home Medications    Prior to Admission medications   Medication Sig Start Date End Date Taking? Authorizing Provider  benzonatate  (TESSALON ) 100 MG capsule Take 1 capsule (100 mg total) by mouth every 8 (eight) hours. 04/13/23   Yes Arneisha Kincannon  N, FNP  ondansetron  (ZOFRAN -ODT) 4 MG disintegrating tablet Take 1 tablet (4 mg total) by mouth every 8 (eight) hours as needed for nausea or vomiting. 04/13/23  Yes Verenise Moulin  N, FNP  amLODipine  (NORVASC ) 10 MG tablet Take 10 mg by mouth daily.    [provider]  hydrochlorothiazide  (HYDRODIURIL ) 12.5 MG tablet Take 1 tablet by mouth once daily 04/10/23   Allwardt, Alyssa M, PA-C  Multiple Vitamins-Minerals (MULTIVITAMIN WITH MINERALS) tablet Take 1 tablet by mouth daily. One-A-Day Men's 50+    [provider]  ondansetron  (ZOFRAN ) 4 MG tablet Take 1 tablet (4 mg total) by mouth every 8 (eight) hours as needed for nausea or vomiting. 02/22/23   Francesca Elsie CROME, MD  promethazine  (PHENERGAN ) 25 MG suppository Place 1 suppository (25 mg total) rectally every 6 (six) hours as needed for nausea or vomiting. Patient not taking: Reported on 02/14/2023 11/29/22   Jesus Bernardino MATSU, MD    Family History Family History  Problem Relation Age of Onset   Colon cancer Neg Hx    Esophageal cancer Neg Hx    Liver disease Neg Hx    Rectal cancer Neg Hx    Stomach cancer Neg Hx     Social History Social History   Tobacco Use   Smoking status: Never  Smokeless tobacco: Never  Vaping Use   Vaping status: Never Used  Substance Use Topics   Alcohol use: Yes    Alcohol/week: 3.0 standard drinks of alcohol    Types: 3 Cans of beer per week    Comment: occasional   Drug use: Not Currently    Types: Marijuana    Comment: last used more than week ago 12-06-22     Allergies   Patient has no known allergies.   Review of Systems Review of Systems  Per HPI   Physical Exam Triage Vital Signs ED Triage Vitals  Encounter Vitals Group     BP 04/13/23 0824 (!) 136/98     Systolic BP Percentile --      Diastolic BP Percentile --      Pulse Rate 04/13/23 0824 91     Resp 04/13/23 0824 18     Temp 04/13/23 0824 98.5 F (36.9 C)     Temp Source  04/13/23 0824 Oral     SpO2 04/13/23 0824 97 %     Weight --      Height --      Head Circumference --      Peak Flow --      Pain Score 04/13/23 0826 0     Pain Loc --      Pain Education --      Exclude from Growth Chart --    No data found.  Updated Vital Signs BP (!) 136/98 (BP Location: Left Arm)   Pulse 91   Temp 98.5 F (36.9 C) (Oral)   Resp 18   SpO2 97%   Visual Acuity Right Eye Distance:   Left Eye Distance:   Bilateral Distance:    Right Eye Near:   Left Eye Near:    Bilateral Near:     Physical Exam Vitals and nursing note reviewed.  Constitutional:      Appearance: He is well-developed.  HENT:     Head: Normocephalic and atraumatic.     Nose: Congestion and rhinorrhea present.     Mouth/Throat:     Mouth: Mucous membranes are moist.     Pharynx: Uvula midline. Posterior oropharyngeal erythema present.     Tonsils: No tonsillar exudate or tonsillar abscesses. 0 on the right. 0 on the left.  Cardiovascular:     Rate and Rhythm: Normal rate and regular rhythm.     Heart sounds: Normal heart sounds. No murmur heard. Pulmonary:     Effort: Pulmonary effort is normal. No respiratory distress.     Breath sounds: Normal breath sounds.  Skin:    General: Skin is warm and dry.  Neurological:     General: No focal deficit present.     Mental Status: He is alert and oriented to person, place, and time.  Psychiatric:        Mood and Affect: Mood normal.        Behavior: Behavior normal.      UC Treatments / Results  Labs (all labs ordered are listed, but only abnormal results are displayed) Labs Reviewed - No data to display  EKG   Radiology No results found.  Procedures Procedures (including critical care time)  Medications Ordered in UC Medications  ondansetron  (ZOFRAN -ODT) disintegrating tablet 4 mg (4 mg Oral Given 04/13/23 0854)    Initial Impression / Assessment and Plan / UC Course  I have reviewed the triage vital signs and the  nursing notes.  Pertinent labs & imaging results  that were available during my care of the patient were reviewed by me and considered in my medical decision making (see chart for details).  Vitals and triage reviewed, patient is hemodynamically stable.  Lungs are vesicular, heart with regular rate and rhythm.  Congestion and rhinorrhea present on physical exam.  Currently nauseous, given Zofran .  No abdominal pain.  Is tolerating p.o. food and fluids, had soup this morning.  Discussed no point-of-care flu or COVID testing, but symptoms are most likely viral.  Symptomatic management discussed.  Plan of care, follow-up care return precautions given, no questions at this time.     Final Clinical Impressions(s) / UC Diagnoses   Final diagnoses:  Pharyngitis, unspecified etiology  Diarrhea, unspecified type  Viral illness     Discharge Instructions      Your symptoms are consistent with a viral illness.  Ensure you are staying well-hydrated with at least 64 ounces of water daily.  You can add an electrolyte solutions like Pedialyte or Gatorade to this.  Follow a bland diet to help with your diarrhea such as bananas, rice, toast and applesauce.  Avoid any fried or spicy foods.  Use the nausea medicine every 8 hours as needed in the cough medicine every 8 hours as well.   Symptoms should improve by Sunday.  If no improvement or any changes please return to clinic for reevaluation.    ED Prescriptions     Medication Sig Dispense Auth. Provider   ondansetron  (ZOFRAN -ODT) 4 MG disintegrating tablet Take 1 tablet (4 mg total) by mouth every 8 (eight) hours as needed for nausea or vomiting. 20 tablet Dreama, Sylena Lotter  N, FNP   benzonatate  (TESSALON ) 100 MG capsule Take 1 capsule (100 mg total) by mouth every 8 (eight) hours. 21 capsule Dreama, Collie Kittel  N, FNP      PDMP not reviewed this encounter.   Dreama, Arlis Everly  N, [place] 04/13/23 [phone]

## 2023-04-13 NOTE — ED Triage Notes (Addendum)
 Pt c/o cough, sore throat, and diarrhea since last Sunday. States sore throat got worse since Wednesday. Took Thera flu with no relief. Denies sore throat at this time but is nauseous.

## 2023-04-13 NOTE — Discharge Instructions (Addendum)
 Your symptoms are consistent with a viral illness.  Ensure you are staying well-hydrated with at least 64 ounces of water daily.  You can add an electrolyte solutions like Pedialyte or Gatorade to this.  Follow a bland diet to help with your diarrhea such as bananas, rice, toast and applesauce.  Avoid any fried or spicy foods.  Use the nausea medicine every 8 hours as needed in the cough medicine every 8 hours as well.   Symptoms should improve by Sunday.  If no improvement or any changes please return to clinic for reevaluation.

## 2023-04-19 ENCOUNTER — Ambulatory Visit (HOSPITAL_COMMUNITY)
Admission: EM | Admit: 2023-04-19 | Discharge: 2023-04-19 | Disposition: A | Discharge status: Home / Self Care | Payer: Managed Care, Other (non HMO) | Attending: Family Medicine | Admitting: Family Medicine

## 2023-04-19 ENCOUNTER — Other Ambulatory Visit: Payer: Self-pay

## 2023-04-19 ENCOUNTER — Encounter (HOSPITAL_COMMUNITY): Payer: Self-pay | Admitting: *Deleted

## 2023-04-19 DIAGNOSIS — J069 Acute upper respiratory infection, unspecified: Secondary | ICD-10-CM

## 2023-04-19 DIAGNOSIS — K047 Periapical abscess without sinus: Secondary | ICD-10-CM | POA: Diagnosis not present

## 2023-04-19 MED ORDER — FLUTICASONE PROPIONATE 50 MCG/ACT NA SUSP: 1.0000 | Freq: Every day | NASAL | 0 refills | Status: DC

## 2023-04-19 NOTE — ED Provider Notes (Signed)
 [facility]    CSN: [id number] Arrival date & time: 04/19/23  5784      History   Chief Complaint Chief Complaint  Patient presents with   Sore Throat   Dental Pain    HPI Nicholas Richmond is a 63 y.o. male.   The patient reports ongoing dental pain for which she has appointment for next week.  He had a tooth fracture and has had an ongoing infection that the dentist is trying to save.  He has not had any fever, chills, purulent drainage from the area, vision changes, sinus pain or facial swelling.  His sore throat that he was seen a week ago has improved some but has not resolved.  He continues to have postnasal drip with some mild congestion but has been able to swallow, eat and drink well, and has not had any rashes, or other ongoing upper respiratory symptoms.  He has an intermittent mild, nonproductive cough.  The history is provided by the patient.  Sore Throat Pertinent negatives include no chest pain.  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   Prediabetes 12/24/2017   Essential hypertension 11/13/2017   Lipoma of back 11/13/2017    Past Surgical History:  Procedure Laterality Date   COLONOSCOPY     HEMORRHOID SURGERY N/A 02/19/2023   Procedure: HEMORRHOIDECTOMY 2 VS 3 COLUMN;  Surgeon: Andria Meuse, MD;  Location: [facility];  Service: General;  Laterality: N/A;   NO PAST SURGERIES     RECTAL EXAM UNDER ANESTHESIA N/A 02/19/2023   Procedure: ANORECTAL EXAM UNDER  ANESTHESIA;  Surgeon: Andria Meuse, MD;  Location: [facility];  Service: General;  Laterality: N/A;       Home Medications    Prior to Admission medications   Medication Sig Start Date End Date Taking? Authorizing Provider  amLODipine (NORVASC) 10 MG tablet Take 10 mg by mouth daily.   Yes [provider]  benzonatate (TESSALON) 100 MG capsule Take 1 capsule (100 mg total) by mouth every 8 (eight) hours. 04/13/23  Yes Rinaldo Ratel, [place] N, FNP  fluticasone [facility]) 50 MCG/ACT nasal spray Place 1 spray into both nostrils daily. 04/19/23  Yes Ivor Messier, MD  hydrochlorothiazide (HYDRODIURIL) 12.5 MG tablet Take 1 tablet by mouth once daily 04/10/23  Yes Allwardt, Alyssa M, PA-C  Multiple Vitamins-Minerals (MULTIVITAMIN WITH MINERALS) tablet Take 1 tablet by mouth daily. One-A-Day Men's 50+   Yes [provider]  ondansetron (ZOFRAN) 4 MG tablet Take 1 tablet (4 mg total) by mouth every 8 (eight) hours as needed for nausea or vomiting. 02/22/23   Lonell Grandchild, MD  ondansetron (ZOFRAN-ODT) 4 MG disintegrating tablet Take 1 tablet (4 mg total) by mouth every 8 (eight) hours as needed for nausea or vomiting. 04/13/23   Garrison, [place] N, FNP  promethazine (PHENERGAN) 25 MG suppository Place 1 suppository (25 mg total) rectally every 6 (six) hours  as needed for nausea or vomiting. Patient not taking: Reported on 02/14/2023 11/29/22   Lula Olszewski, MD    Family History Family History  Problem Relation Age of Onset   Colon cancer Neg Hx    Esophageal cancer Neg Hx    Liver disease Neg Hx    Rectal cancer Neg Hx    Stomach cancer Neg Hx     Social History Social History   Tobacco Use   Smoking status: Never   Smokeless tobacco: Never  Vaping Use   Vaping status: Never Used  Substance Use Topics   Alcohol use: Yes    Alcohol/week: 3.0 standard drinks of alcohol    Types: 3 Cans of beer per week    Comment: occasional   Drug use: Not Currently     Types: Marijuana    Comment: last used more than week ago 12-06-22     Allergies   Patient has no known allergies.   Review of Systems Review of Systems  Constitutional:  Negative for appetite change, chills and fever.  HENT:  Positive for congestion, dental problem, postnasal drip, rhinorrhea and sore throat. Negative for drooling, ear pain, facial swelling, mouth sores, sinus pressure, sinus pain, sneezing, tinnitus, trouble swallowing and voice change.   Respiratory:  Negative for cough and wheezing.   Cardiovascular:  Negative for chest pain.  Gastrointestinal:  Negative for diarrhea, nausea and vomiting.  Musculoskeletal:  Negative for arthralgias, myalgias, neck pain and neck stiffness.  Skin:  Negative for color change and rash.  Hematological:  Negative for adenopathy.  Psychiatric/Behavioral:  Negative for confusion.      Physical Exam Triage Vital Signs ED Triage Vitals  Encounter Vitals Group     BP 04/19/23 1011 133/83     Systolic BP Percentile --      Diastolic BP Percentile --      Pulse Rate 04/19/23 1011 72     Resp 04/19/23 1011 18     Temp 04/19/23 1011 99.5 F (37.5 C)     Temp src --      SpO2 04/19/23 1011 98 %     Weight --      Height --      Head Circumference --      Peak Flow --      Pain Score 04/19/23 1008 6     Pain Loc --      Pain Education --      Exclude from Growth Chart --    No data found.  Updated Vital Signs BP 133/83   Pulse 72   Temp 99.5 F (37.5 C)   Resp 18   SpO2 98%   Visual Acuity Right Eye Distance:   Left Eye Distance:   Bilateral Distance:    Right Eye Near:   Left Eye Near:    Bilateral Near:     Physical Exam Vitals reviewed.  Constitutional:      General: He is not in acute distress.    Appearance: He is well-developed and normal weight. He is not ill-appearing, toxic-appearing or diaphoretic.  HENT:     Head: Normocephalic and atraumatic.     Comments: No tenderness to palpation over the  frontal, or maxillary sinuses    Right Ear: Tympanic membrane normal. Tympanic membrane is not erythematous.     Left Ear: Tympanic membrane normal. Tympanic membrane is not erythematous.     Nose: Congestion present. No rhinorrhea.     Mouth/Throat:  Mouth: Mucous membranes are moist. No oral lesions.     Pharynx: No pharyngeal swelling, oropharyngeal exudate, posterior oropharyngeal erythema or uvula swelling.     Tonsils: No tonsillar exudate or tonsillar abscesses.     Comments: Copious, clear, posterior oropharyngeal drainage with cobblestoning Patient swallows without difficulty There is some trace swelling of the gingiva superior to the first upper right premolar with a chronic fracture No purulent drainage or bleeding Eyes:     Conjunctiva/sclera: Conjunctivae normal.     Pupils: Pupils are equal, round, and reactive to light.  Pulmonary:     Effort: Pulmonary effort is normal.  Musculoskeletal:     Cervical back: Normal range of motion and neck supple.  Lymphadenopathy:     Cervical: No cervical adenopathy.  Skin:    General: Skin is warm.     Capillary Refill: Capillary refill takes 2 to 3 seconds.     Findings: No rash.  Neurological:     Mental Status: He is alert.      UC Treatments / Results  Labs (all labs ordered are listed, but only abnormal results are displayed) Labs Reviewed - No data to display  EKG   Radiology No results found.  Procedures Procedures (including critical care time)  Medications Ordered in UC Medications - No data to display  Initial Impression / Assessment and Plan / UC Course  I have reviewed the triage vital signs and the nursing notes.  Pertinent labs & imaging results that were available during my care of the patient were reviewed by me and considered in my medical decision making (see chart for details).     Upper respiratory infection, improving -The patient's history and exam are not concerning for strep throat or  other new bacterial infections.  With his improving course it is likely that this is just ongoing from his viral infection. - We discussed a multifaceted approach to symptom control - The patient will start daily Flonase for the next week, continue good hydration, and use antihistamines or Mucinex as well as salt water and warm water gargles. - We discussed return criteria. -The patient voiced understanding and agreement with the plan  Dental abscess -The patient has had ongoing issues with this tooth since last year.  He was treated with Augmentin a year ago which did help resolve symptoms. - He has been taking 400 mg of ibuprofen twice daily with some mild relief. - We discussed starting a regimen of Tylenol 1000 mg 3 times daily staggering with ibuprofen at the same dose twice daily for pain control. - He can also start salt water mouth gargles, topical OTC dental anesthetic, and application of heat or ice. - He does have a dental appointment next week.  At this point he has no systemic symptoms.   -I discussed with the patient that if he has worsening pain with this new regimen with fevers, or purulent drainage he can return for antibiotic therapy.  Final Clinical Impressions(s) / UC Diagnoses   Final diagnoses:  Viral upper respiratory tract infection  Dental infection     Discharge Instructions      You have an upper respiratory infection. Most cases are due to a virus and do not require antibiotics for treatment.  Make sure to continue good oral hydration.  You can take tylenol for fever as needed Start Flonase daily for the next week and then as needed. You can use nasal saline spray multiple times daily as well.  You can use  a daily antihistamine or guaifenesin as an expectorant but you need to be well hydrated for these medications to work.  Get adequate rest for recovery Maintain distance from others and wear a mask in public areas to avoid spread  If you start to  experience shortness of breath, fevers that don't respond to medication, confusion, profound neck stiffness, or fainting, return to the urgent care or ED.   Start Tylenol 1000mg  by mouth three times a day.  You can take ibuprofen 200mg , one-three tablets twice a day in between the Tylenol doses You can also apply ice and topical numbing medication that is purchased over the counter.  Make sure to follow up with your dentist next week.  If you develop fevers or chills in the mean time return to the clinic     ED Prescriptions     Medication Sig Dispense Auth. Provider   fluticasone (FLONASE) 50 MCG/ACT nasal spray Place 1 spray into both nostrils daily. 1 g Ivor Messier, MD      PDMP not reviewed this encounter.   Ivor Messier, MD 04/19/23 1248

## 2023-04-19 NOTE — Discharge Instructions (Addendum)
You have an upper respiratory infection. Most cases are due to a virus and do not require antibiotics for treatment.  Make sure to continue good oral hydration.  You can take tylenol for fever as needed Start Flonase daily for the next week and then as needed. You can use nasal saline spray multiple times daily as well.  You can use a daily antihistamine or guaifenesin as an expectorant but you need to be well hydrated for these medications to work.  Get adequate rest for recovery Maintain distance from others and wear a mask in public areas to avoid spread  If you start to experience shortness of breath, fevers that don't respond to medication, confusion, profound neck stiffness, or fainting, return to the urgent care or ED.   Start Tylenol 1000mg  by mouth three times a day.  You can take ibuprofen 200mg , one-three tablets twice a day in between the Tylenol doses You can also apply ice and topical numbing medication that is purchased over the counter.  Make sure to follow up with your dentist next week.  If you develop fevers or chills in the mean time return to the clinic

## 2023-04-19 NOTE — ED Triage Notes (Signed)
Pt reports he has a sore throat and was seen last week fo rsame problem. His sore throat has not improved. Pt also has RT sided dental pain. Pt has a appt. In Feb with dentist.

## 2023-05-08 ENCOUNTER — Other Ambulatory Visit: Payer: Self-pay | Admitting: Physician Assistant

## 2023-05-08 DIAGNOSIS — I10 Essential (primary) hypertension: Secondary | ICD-10-CM

## 2023-05-16 ENCOUNTER — Ambulatory Visit: Payer: Managed Care, Other (non HMO) | Admitting: Physician Assistant

## 2023-05-16 ENCOUNTER — Encounter: Payer: Self-pay | Admitting: Physician Assistant

## 2023-05-16 VITALS — BP 132/78 | HR 98 | Temp 98.0°F | Ht 71.0 in | Wt 165.6 lb

## 2023-05-16 DIAGNOSIS — Z23 Encounter for immunization: Secondary | ICD-10-CM

## 2023-05-16 DIAGNOSIS — Z0000 Encounter for general adult medical examination without abnormal findings: Secondary | ICD-10-CM

## 2023-05-16 DIAGNOSIS — I10 Essential (primary) hypertension: Secondary | ICD-10-CM | POA: Diagnosis not present

## 2023-05-16 DIAGNOSIS — D1723 Benign lipomatous neoplasm of skin and subcutaneous tissue of right leg: Secondary | ICD-10-CM

## 2023-05-16 NOTE — Progress Notes (Signed)
 Patient ID: Nicholas Richmond, male    DOB: 1960/07/19, 63 y.o.   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 Recent labs stable, defer at this time after reviewed with pt. Preventive measures discussed and printed in AVS for patient.   Patient Counseling: [x]   Nutrition: Stressed importance of moderation in sodium/caffeine intake, saturated fat and cholesterol, caloric balance, sufficient intake of fresh fruits, vegetables, and fiber.  [x]   Stressed the importance of regular exercise.   [x]   Substance Abuse: Discussed cessation/primary prevention of tobacco, alcohol, or other drug use; driving or other dangerous activities under the influence; availability of treatment for abuse.   []   Injury prevention: Discussed safety belts, safety helmets, smoke detector, smoking near bedding or upholstery.   []   Sexuality: Discussed sexually transmitted diseases, partner selection, use of condoms, avoidance of unintended pregnancy  and contraceptive alternatives.   [x]   Dental health: Discussed importance of regular tooth brushing, flossing, and dental visits.  [x]   Health maintenance and immunizations reviewed. Please refer to Health maintenance section.     Hypertension Hypertension well-controlled. Explained exercise benefits but age-related arterial changes may require continued medication. Cramps possibly due to medication or electrolyte imbalance. - Continue current antihypertensive regimen. - Consider magnesium supplementation for cramps. - Ensure adequate hydration.  R lower leg soft mobile mass, reassured likely lipoma, monitor.   Shingles vaccination Due for vaccination, no contraindications. - Administer shingles  vaccine.  General Health Maintenance Resumed vitamins post-surgery, recent tests normal. Discussed exercise and hydration benefits. - Encourage continued exercise and hydration. - Plan for blood work in six months.     Return in about 6 months (around 11/16/2023) for recheck/follow-up.    Subjective:    Chief Complaint  Patient presents with   Annual Exam    Pt is present for Annual Exam is fasting for labs.    HPI Discussed the use of AI scribe software for clinical note transcription with the patient, who gave verbal consent to proceed.  History of Present Illness   He presents for annual exam & for a shingles vaccination.  He did not receive a flu shot this year and experienced illness during the winter, which he attributes to the lack of vaccination. He visited urgent care in early February due to feeling unwell and believes he transmitted the illness to his wife, who also required urgent care for a broken toe.  He underwent surgery in January for hemorrhoidectomy and reports a smooth recovery without complications. He returned to work on February 3rd and was informed that full recovery would take about six months. He describes feeling lazy during the recovery period but has since resumed normal activities. He also participated in jury duty for a murder trial shortly after his surgery, which lasted eight days and was an intense experience.  He experiences cramps in his feet, particularly at night, which wake him from sleep. He has to get up and walk to relieve the cramps and suspects it might be related to his medication, amlodipine, or possibly a potassium deficiency.  He discusses his urinary habits, noting regular bathroom visits but increased frequency on Mondays after drinking beer on Sundays. He takes a water pill, which he believes contributes to  this pattern.  He is currently taking amlodipine and hydrochlorothiazide for blood pressure management. He was advised to  stop taking vitamins post-surgery but has since resumed them.  He mentions a fatty tumor on his right leg that sometimes throbs and causes discomfort, although it is not currently bothering him.       Past Medical History:  Diagnosis Date   Arthritis    Asthma    Fatty liver    Hemorrhoids    Hypertension    Pre-diabetes    Substance abuse (HCC)    current marijuana use and  Remote cocaine history    Past Surgical History:  Procedure Laterality Date   COLONOSCOPY     HEMORRHOID SURGERY N/A 02/19/2023   Procedure: HEMORRHOIDECTOMY 2 VS 3 COLUMN;  Surgeon: Andria Meuse, MD;  Location: [facility];  Service: General;  Laterality: N/A;   NO PAST SURGERIES     RECTAL EXAM UNDER ANESTHESIA N/A 02/19/2023   Procedure: ANORECTAL EXAM UNDER ANESTHESIA;  Surgeon: Andria Meuse, MD;  Location: [facility];  Service: General;  Laterality: N/A;    Family History  Problem Relation Age of Onset   Colon cancer Neg Hx    Esophageal cancer Neg Hx    Liver disease Neg Hx    Rectal cancer Neg Hx    Stomach cancer Neg Hx     Social History   Tobacco Use   Smoking status: Never   Smokeless tobacco: Never  Vaping Use   Vaping status: Never Used  Substance Use Topics   Alcohol use: Yes    Alcohol/week: 3.0 standard drinks of alcohol    Types: 3 Cans of beer per week    Comment: occasional   Drug use: Not Currently    Types: Marijuana    Comment: last used more than week ago 12-06-22     No Known Allergies  Review of Systems NEGATIVE UNLESS OTHERWISE INDICATED IN HPI      Objective:     BP 132/78   Pulse 98   Temp 98 F (36.7 C) (Temporal)   Ht 5\' 11"  (1.803 m)   Wt 165 lb 9.6 oz (75.1 kg)   SpO2 98%   BMI 23.10 kg/m   Wt Readings from Last 3 Encounters:  05/16/23 165 lb 9.6 oz (75.1 kg)  02/22/23 170 lb (77.1 kg)  02/19/23 170 lb 9.6 oz (77.4 kg)    BP Readings from Last 3 Encounters:  05/16/23 132/78  04/19/23 133/83  04/13/23 (!) 136/98     Physical  Exam Vitals and nursing note reviewed.  Constitutional:      General: He is not in acute distress.    Appearance: Normal appearance. He is not toxic-appearing.  HENT:     Head: Normocephalic and atraumatic.     Right Ear: External ear normal.     Left Ear: External ear normal.  Eyes:     Extraocular Movements: Extraocular movements intact.     Conjunctiva/sclera: Conjunctivae normal.     Pupils: Pupils are equal, round, and reactive to light.  Cardiovascular:     Rate and Rhythm: Normal rate and regular rhythm.     Pulses: Normal pulses.     Heart sounds: Normal heart sounds.  Pulmonary:     Effort: Pulmonary effort is normal.     Breath sounds: Normal breath sounds.  Musculoskeletal:     Cervical back: Normal range of motion and neck supple.     Right lower leg: No edema.  Left lower leg: No edema.  Skin:    General: Skin is warm and dry.     Findings: Lesion (R anterior shin very small <0.5 cm subcutaneous mobile soft mass) present.  Neurological:     General: No focal deficit present.     Mental Status: He is alert and oriented to person, place, and time.  Psychiatric:        Mood and Affect: Mood normal.        Behavior: Behavior normal.       Excell Neyland M Leonel Mccollum, PA-C

## 2023-06-30 IMAGING — MR MR HEAD W/O CM
9 of 10 series · 36 of 48 positions shown · non-contrast
Comparison: None.

CLINICAL DATA: Neuro deficit, acute stroke suspected.

EXAM:
MRI HEAD WITHOUT CONTRAST
TECHNIQUE: Multiplanar, multiecho pulse sequences of the brain and surrounding
structures were obtained without intravenous contrast.

[Series 3: DWI · axial · 3.0mm · 1.09mm/px · z∈[-63,+66]mm · 8 of 88 slices shown (1 of 4)]
[im 1/88]
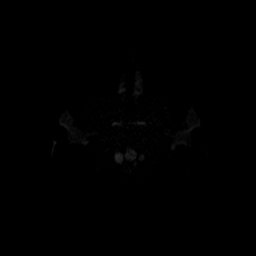
[im 13/88]
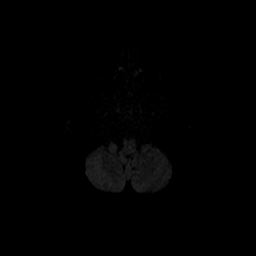
[im 25/88]
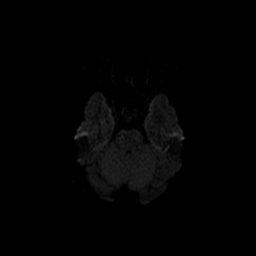
[im 38/88]
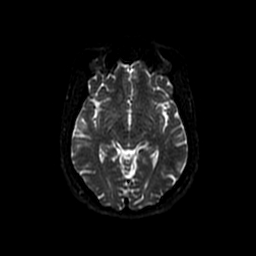
[im 50/88]
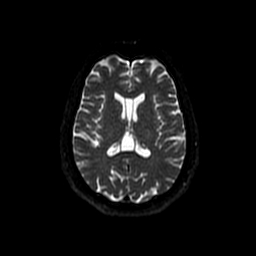
[im 63/88]
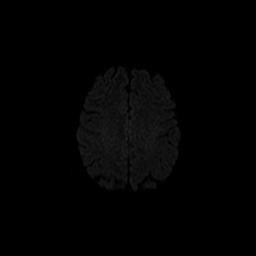
[im 75/88]
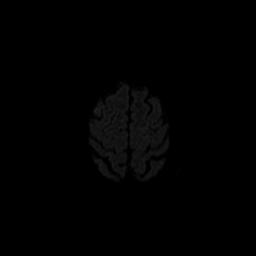
[im 88/88]
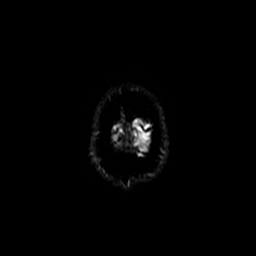

[Series 4: DWI · coronal · 5.0mm · 1.09mm/px · 7 of 68 slices shown (2 of 4)]
[im 1/68]
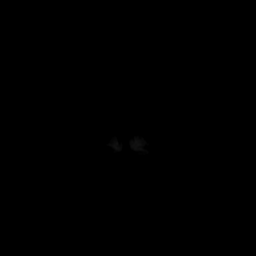
[im 12/68]
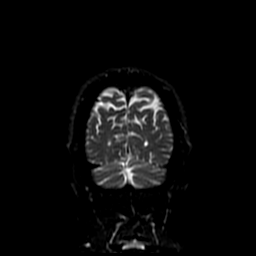
[im 23/68]
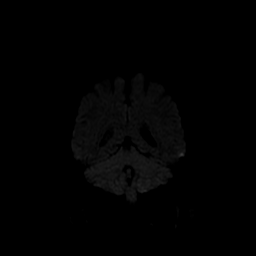
[im 34/68]
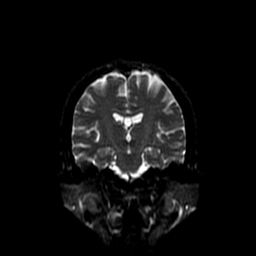
[im 45/68]
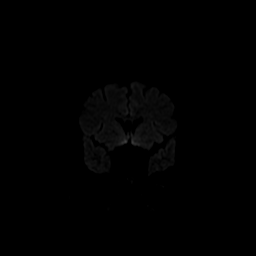
[im 56/68]
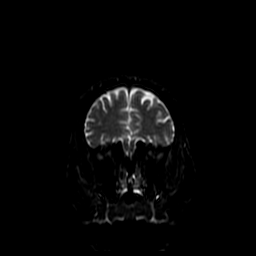
[im 68/68]
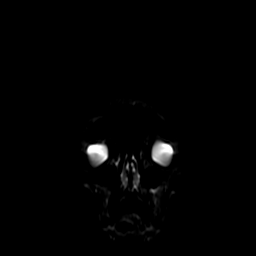

[Series 5: T1 · sagittal · 5.0mm · 0.47mm/px · 2 of 23 slices shown (1 of 2)]
[im 1/23]
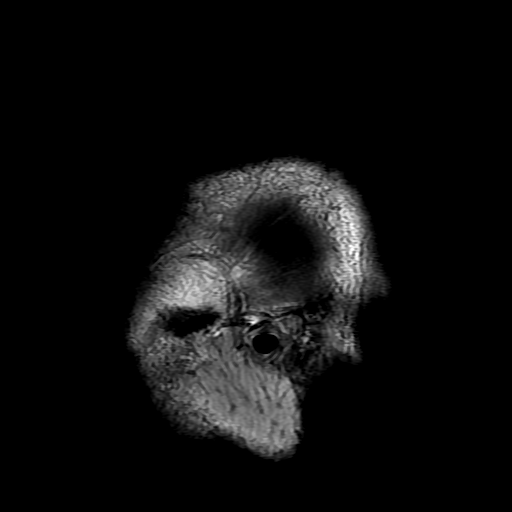
[im 23/23]
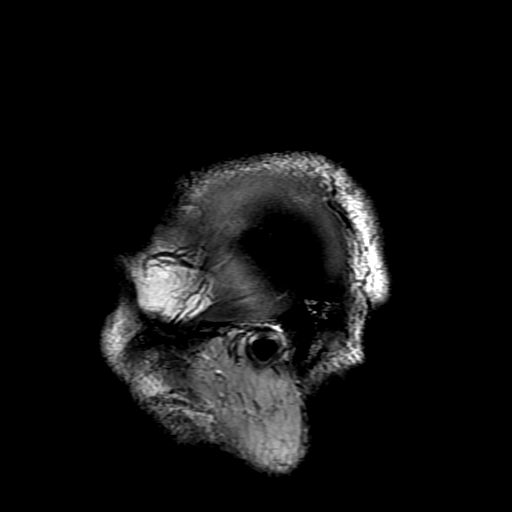

[Series 6: T2 · axial · 5.0mm · 0.43mm/px · z∈[-76,+61]mm · 3 of 24 slices shown (1 of 2)]
[im 1/24]
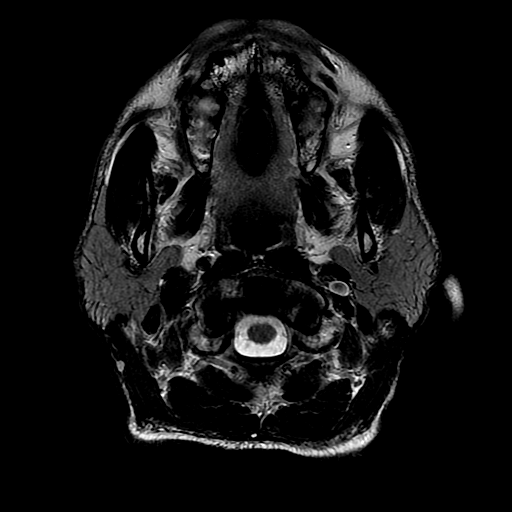
[im 12/24]
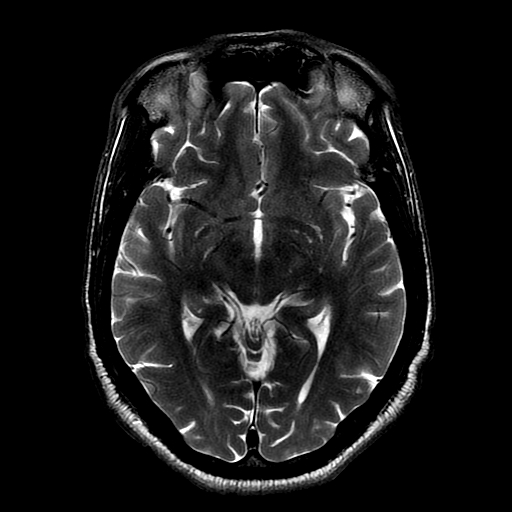
[im 24/24]
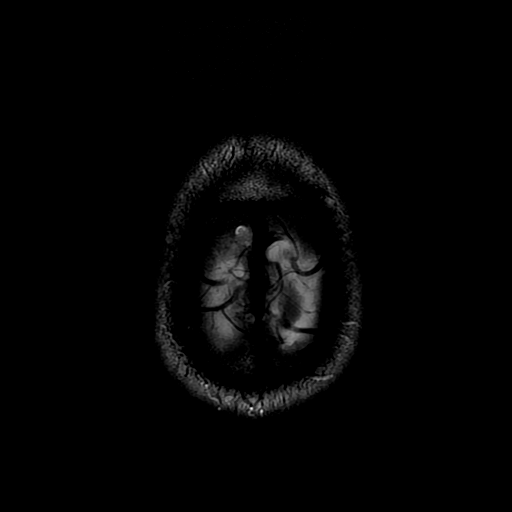

[Series 7: FLAIR · axial · 3.0mm · 0.43mm/px · z∈[-76,+61]mm · 3 of 24 slices shown]
[im 1/24]
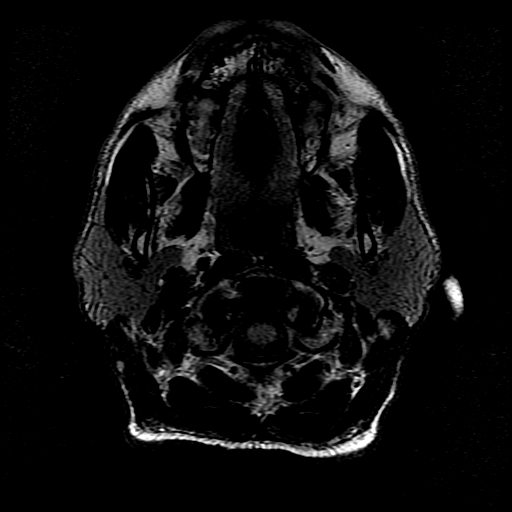
[im 12/24]
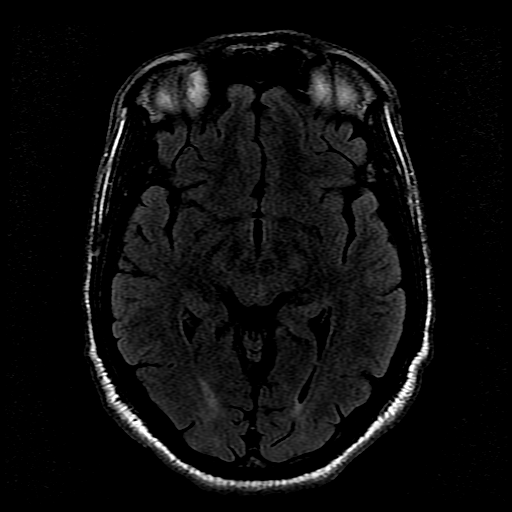
[im 24/24]
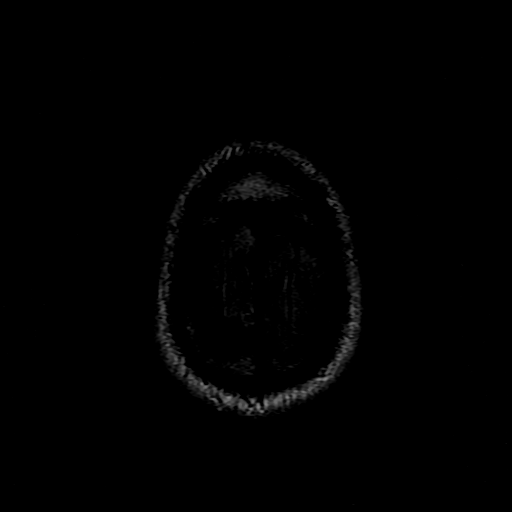

[Series 8: T1 · axial · 3.0mm · 0.47mm/px · 1 of 100 slices shown (2 of 2)]
[im 1/100]
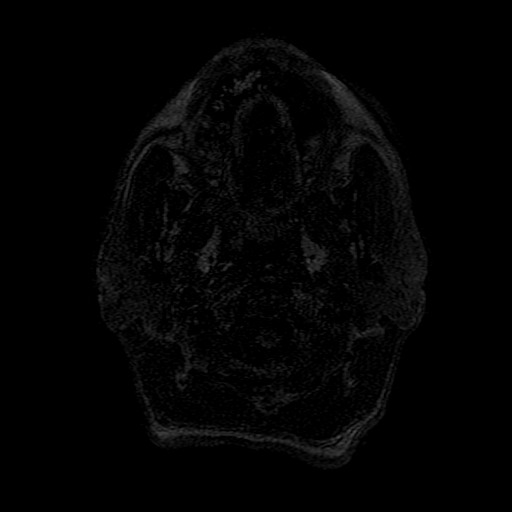

[Series 10: T2 · coronal · 5.0mm · 0.43mm/px · 3 of 29 slices shown (2 of 2)]
[im 1/29]
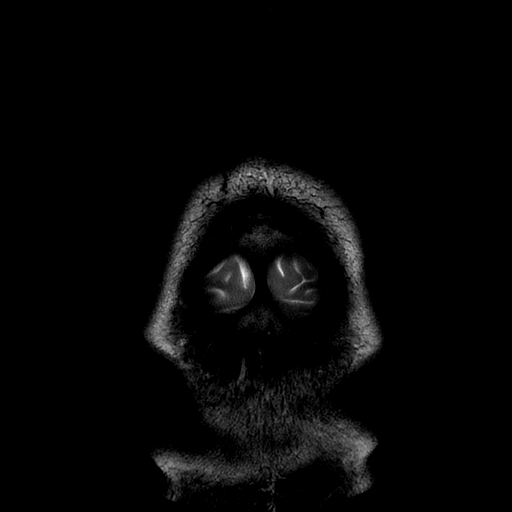
[im 15/29]
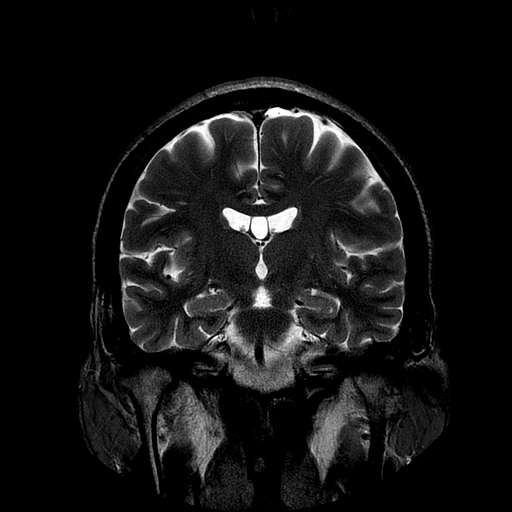
[im 29/29]
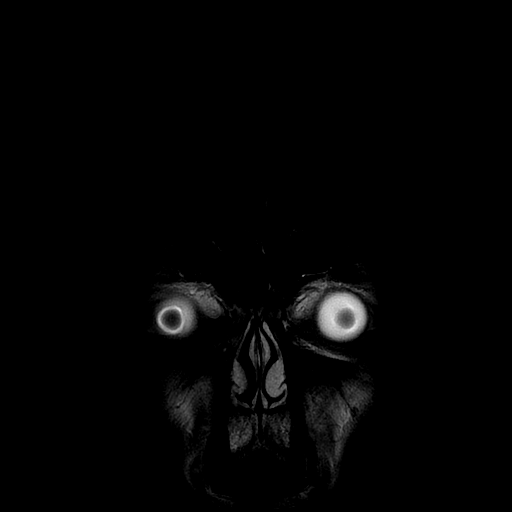

[Series 300: DWI · axial · 3.0mm · 1.09mm/px · z∈[-63,+66]mm · 5 of 44 slices shown (3 of 4)]
[im 1/44]
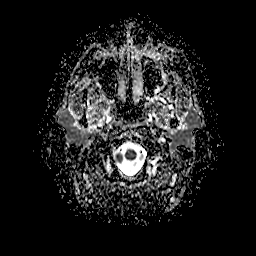
[im 11/44]
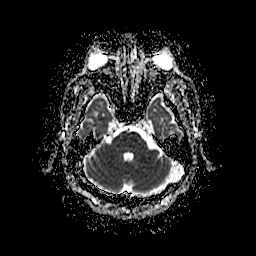
[im 22/44]
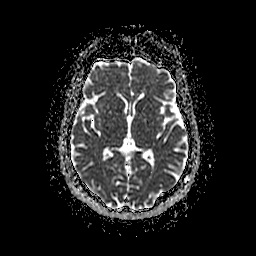
[im 33/44]
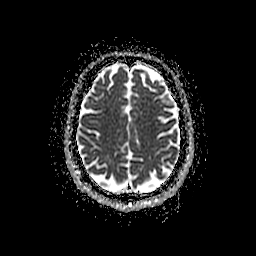
[im 44/44]
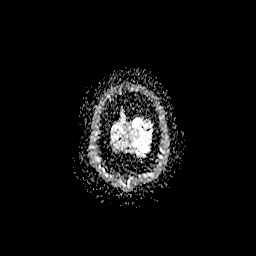

[Series 400: DWI · coronal · 5.0mm · 1.09mm/px · 4 of 34 slices shown (4 of 4)]
[im 1/34]
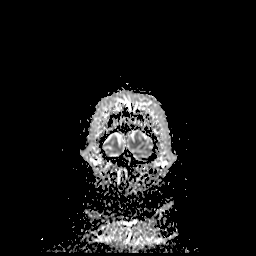
[im 12/34]
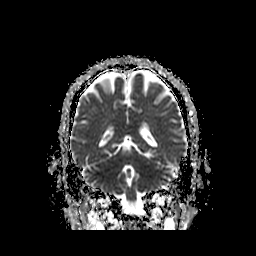
[im 23/34]
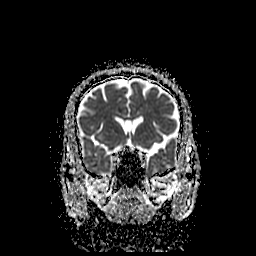
[im 34/34]
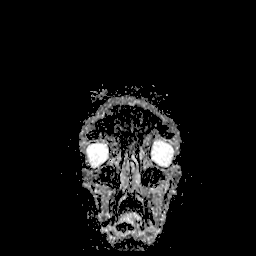

[36 of 48 positions shown; findings below may reference images not displayed]

FINDINGS: Brain: No acute infarction, hemorrhage, hydrocephalus, extra-axial
collection or mass lesion. There are a few small T2 hyperintense
white matter lesions, likely the sequela of mild chronic
microvascular ischemic disease. Cavum septum pellucidum et vergae,
anatomic variant.

Vascular: Major arterial flow voids are maintained at the skull
base.

Skull and upper cervical spine: Normal marrow signal.

Sinuses/Orbits: Mild paranasal sinus mucosal thickening. No acute
orbital abnormality.

Other: No mastoid effusions.
IMPRESSION: No evidence of acute intracranial abnormality. Specifically, no
acute infarct.

## 2023-10-04 ENCOUNTER — Emergency Department (HOSPITAL_COMMUNITY)
Admission: EM | Admit: 2023-10-04 | Discharge: 2023-10-04 | Disposition: A | Discharge status: Home / Self Care | Attending: Emergency Medicine | Admitting: Emergency Medicine

## 2023-10-04 ENCOUNTER — Other Ambulatory Visit: Payer: Self-pay

## 2023-10-04 ENCOUNTER — Emergency Department (HOSPITAL_COMMUNITY)

## 2023-10-04 DIAGNOSIS — R079 Chest pain, unspecified: Secondary | ICD-10-CM | POA: Diagnosis present

## 2023-10-04 DIAGNOSIS — Z79899 Other long term (current) drug therapy: Secondary | ICD-10-CM | POA: Diagnosis not present

## 2023-10-04 DIAGNOSIS — R0789 Other chest pain: Secondary | ICD-10-CM | POA: Insufficient documentation

## 2023-10-04 DIAGNOSIS — M25512 Pain in left shoulder: Secondary | ICD-10-CM | POA: Diagnosis not present

## 2023-10-04 DIAGNOSIS — I10 Essential (primary) hypertension: Secondary | ICD-10-CM | POA: Diagnosis not present

## 2023-10-04 LAB — CBC
HCT: 42.7 % (ref 39.0–52.0)
Hemoglobin: 14.8 g/dL (ref 13.0–17.0)
MCH: 33.8 pg (ref 26.0–34.0)
MCHC: 34.7 g/dL (ref 30.0–36.0)
MCV: 97.5 fL (ref 80.0–100.0)
Platelets: 281 K/uL (ref 150–400)
RBC: 4.38 MIL/uL (ref 4.22–5.81)
RDW: 12.2 % (ref 11.5–15.5)
WBC: 3.4 K/uL — ABNORMAL LOW (ref 4.0–10.5)
nRBC: 0 % (ref 0.0–0.2)

## 2023-10-04 LAB — BASIC METABOLIC PANEL WITH GFR
Anion gap: 7 (ref 5–15)
BUN: 18 mg/dL (ref 6–20)
CO2: 22 mmol/L (ref 22–32)
Calcium: 9.2 mg/dL (ref 8.9–10.3)
Chloride: 109 mmol/L (ref 98–111)
Creatinine, Ser: 1.13 mg/dL (ref 0.61–1.24)
GFR, Estimated: >60 mL/min (ref >60)
Glucose, Bld: 110 mg/dL — ABNORMAL HIGH (ref 70–99)
Potassium: 4 mmol/L (ref 3.5–5.1)
Sodium: 138 mmol/L (ref 135–145)

## 2023-10-04 LAB — TROPONIN I (HIGH SENSITIVITY): Troponin I (High Sensitivity): 4 ng/L (ref <18)

## 2023-10-04 MED ORDER — METHYLPREDNISOLONE 4 MG PO TBPK: ORAL_TABLET | ORAL | 0 refills | Status: AC

## 2023-10-04 NOTE — ED Triage Notes (Signed)
 Correction to previous comment: hx of HTN and prediabetes

## 2023-10-04 NOTE — Discharge Instructions (Signed)
Take the prescribed medication as directed. °Follow-up with your primary care doctor. °Return to the ED for new or worsening symptoms. °

## 2023-10-04 NOTE — ED Provider Notes (Signed)
 [place] EMERGENCY DEPARTMENT AT [facility] Provider Note   CSN: [id number] Arrival date & time: 10/04/23  9644     Patient presents with: Chest Pain and Numbness   Nicholas Richmond is a 63 y.o. male.   The history is provided by the patient and medical records.  Chest Pain  63 year old male with history of hypertension, fatty liver, prediabetes, presenting to the ED with chest and left shoulder pain.  States he woke up from sleep and felt some discomfort.  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  He denies any shortness of breath, diaphoresis, nausea, or vomiting.  No prior cardiac disease.  He used to smoke marijuana but stopped this several years ago, also stopped drinking.  Prior to Admission medications   Medication Sig Start Date End Date Taking? Authorizing Provider  amLODipine  (NORVASC ) 10 MG tablet Take 10 mg by mouth daily.    [provider]  hydrochlorothiazide  (HYDRODIURIL ) 12.5 MG tablet Take 1 tablet by mouth once daily 05/08/23   Allwardt, Alyssa M, PA-C  Multiple Vitamins-Minerals (MULTIVITAMIN WITH MINERALS) tablet Take 1 tablet by mouth daily. One-A-Day Men's 50+    [provider]    Allergies: Patient has no known allergies.    Review of Systems  Cardiovascular:  Positive for chest pain.  All other systems reviewed and are negative.   Updated Vital Signs BP (!) 164/118 (BP Location: Right Arm)   Pulse 78   Temp 98.4 F (36.9 C) (Oral)   Resp 16   Ht 5' 11 (1.803 m)   Wt 81.2 kg   SpO2 100%   BMI 24.97 kg/m   Physical Exam Vitals and nursing note reviewed.  Constitutional:      Appearance: He is well-developed.  HENT:     Head: Normocephalic and atraumatic.  Eyes:     Conjunctiva/sclera: Conjunctivae normal.     Pupils: Pupils are equal, round, and reactive to  light.  Cardiovascular:     Rate and Rhythm: Normal rate and regular rhythm.     Heart sounds: Normal heart sounds.  Pulmonary:     Effort: Pulmonary effort is normal.     Breath sounds: Normal breath sounds.  Chest:       Comments: Tender left upper lateral chest and into left shoulder, no deformities present Abdominal:     General: Bowel sounds are normal.     Palpations: Abdomen is soft.  Musculoskeletal:        General: Normal range of motion.     Cervical back: Normal range of motion.     Comments: Normal ROM of left shoulder, some minor popping which is not painful, mildly tender along left cervical paraspinal musculature, no midline step-off or deformity, normal grip strength, normal sensation left arm  Skin:    General: Skin is warm and dry.  Neurological:     Mental Status: He is alert and oriented to person, place, and time.     (all labs ordered are listed, but only abnormal results are displayed) Labs Reviewed  BASIC METABOLIC PANEL WITH GFR - Abnormal; Notable for the following components:      Result Value   Glucose, Bld 110 (*)    All other components within normal limits  CBC - Abnormal; Notable for the following components:   WBC 3.4 (*)  All other components within normal limits  TROPONIN I (HIGH SENSITIVITY)    EKG: None  Radiology: DG Chest 2 View Result Date: 10/04/2023 EXAM: 2 VIEW(S) XRAY OF THE CHEST 10/04/2023 04:30:59 AM COMPARISON: 09/23/2020 CLINICAL HISTORY: Chest pain. FINDINGS: LUNGS AND PLEURA: No focal pulmonary opacity. No pulmonary edema. No pleural effusion. No pneumothorax. HEART AND MEDIASTINUM: No acute abnormality of the cardiac and mediastinal silhouettes. BONES AND SOFT TISSUES: No acute osseous abnormality. IMPRESSION: 1. No acute process. Electronically signed by: Waddell Calk MD 10/04/2023 04:55 AM EDT RP Workstation: [id number]     Procedures   Medications Ordered in the ED - No data to display                                   Medical Decision Making Amount and/or Complexity of Data Reviewed Labs: ordered. Radiology: ordered and independent interpretation performed. ECG/medicine tests: ordered and independent interpretation performed.  Risk Prescription drug management.   63 y.o. male presenting to the ED with chest pain.  Present upon waking this morning.  Also feeling it into the left shoulder.  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  He is afebrile and nontoxic.  He is overall well-appearing.  Tenderness to left upper lateral chest, more so into the shoulder region.  Does have some popping with range of motion but denies any significant pain with that.  He has normal grip strength, normal sensation throughout the left arm.  Some minor tenderness along left posterior neck as well.  No focal deficits.    EKG is nonischemic.  His labs are reassuring, troponin is normal.  Chest x-ray is clear.  Patient's symptoms seem more musculoskeletal in nature.  He does have some risk factors including hypertension and prediabetes.  We have had a discussion regarding continued observation here and obtaining delta troponin, he would prefer just to go home now.  I do think this is reasonable.  He may have some radiculopathy as he does have some pain into the left posterior neck as well.  Will state medrol  dosepak and have him follow-up closely with PCP.  Can return here for new concerns.  Final diagnoses:  Atypical chest pain  Acute pain of left shoulder    ED Discharge Orders          Ordered    methylPREDNISolone  (MEDROL  DOSEPAK) 4 MG TBPK tablet        10/04/23 0520               Jarold Olam HERO, PA-C 10/04/23 0543    Mesner, Selinda, MD 10/04/23 [phone]

## 2023-10-04 NOTE — ED Triage Notes (Signed)
 Pt coming in with reports of waking up this morning around 0100 with left sided CP radiating to left arm along with numbness/tingling throughout arm. Numbness has remained constant. No aggravating/alleviating factors. Pt denies SOB, weakness, fatigue, dizziness. No medical problems reported. Pt does reports lifting boxes frequently for work. Nad noted.
